# Patient Record
Sex: Female | Born: 1990 | Race: White | Hispanic: No | Marital: Single | Smoking: Current every day smoker
Health system: Southern US, Community
[De-identification: ages and names within clinical notes are randomized; demographics above are authoritative.]

## PROBLEM LIST (undated history)

## (undated) DIAGNOSIS — J40 Bronchitis, not specified as acute or chronic: Secondary | ICD-10-CM

## (undated) DIAGNOSIS — G5600 Carpal tunnel syndrome, unspecified upper limb: Secondary | ICD-10-CM

## (undated) DIAGNOSIS — I10 Essential (primary) hypertension: Secondary | ICD-10-CM

## (undated) DIAGNOSIS — J45909 Unspecified asthma, uncomplicated: Secondary | ICD-10-CM

## (undated) DIAGNOSIS — M7710 Lateral epicondylitis, unspecified elbow: Secondary | ICD-10-CM

## (undated) HISTORY — PX: OTHER SURGICAL HISTORY: SHX169

---

## 2010-11-19 ENCOUNTER — Emergency Department (HOSPITAL_COMMUNITY)
Admission: EM | Admit: 2010-11-19 | Discharge: 2010-11-19 | Payer: Self-pay | Source: Home / Self Care | Admitting: Emergency Medicine

## 2010-11-23 ENCOUNTER — Ambulatory Visit (HOSPITAL_COMMUNITY)
Admission: RE | Admit: 2010-11-23 | Discharge: 2010-11-23 | Payer: Self-pay | Source: Home / Self Care | Attending: Obstetrics and Gynecology | Admitting: Obstetrics and Gynecology

## 2010-11-29 LAB — CBC
HCT: 31.8 % — ABNORMAL LOW (ref 36.0–46.0)
Hemoglobin: 11.4 g/dL — ABNORMAL LOW (ref 12.0–15.0)
MCH: 31.3 pg (ref 26.0–34.0)
MCHC: 35.8 g/dL (ref 30.0–36.0)
MCV: 87.4 fL (ref 78.0–100.0)
Platelets: 108 10*3/uL — ABNORMAL LOW (ref 150–400)
RBC: 3.64 MIL/uL — ABNORMAL LOW (ref 3.87–5.11)
RDW: 12.7 % (ref 11.5–15.5)
WBC: 6.1 10*3/uL (ref 4.0–10.5)

## 2010-11-29 LAB — COMPREHENSIVE METABOLIC PANEL
ALT: 16 U/L (ref 0–35)
AST: 18 U/L (ref 0–37)
Albumin: 3.6 g/dL (ref 3.5–5.2)
Alkaline Phosphatase: 40 U/L (ref 39–117)
BUN: 7 mg/dL (ref 6–23)
CO2: 26 mEq/L (ref 19–32)
Calcium: 8.8 mg/dL (ref 8.4–10.5)
Chloride: 101 mEq/L (ref 96–112)
Creatinine, Ser: 0.48 mg/dL (ref 0.4–1.2)
GFR calc Af Amer: >60 mL/min (ref >60)
GFR calc non Af Amer: >60 mL/min (ref >60)
Glucose, Bld: 82 mg/dL (ref 70–99)
Potassium: 3.8 mEq/L (ref 3.5–5.1)
Sodium: 134 mEq/L — ABNORMAL LOW (ref 135–145)
Total Bilirubin: 0.6 mg/dL (ref 0.3–1.2)
Total Protein: 6.4 g/dL (ref 6.0–8.3)

## 2010-11-29 LAB — DIFFERENTIAL
Basophils Absolute: 0 10*3/uL (ref 0.0–0.1)
Basophils Relative: 1 % (ref 0–1)
Eosinophils Absolute: 0.1 10*3/uL (ref 0.0–0.7)
Eosinophils Relative: 1 % (ref 0–5)
Lymphocytes Relative: 15 % (ref 12–46)
Lymphs Abs: 0.9 10*3/uL (ref 0.7–4.0)
Monocytes Absolute: 0.3 10*3/uL (ref 0.1–1.0)
Monocytes Relative: 6 % (ref 3–12)
Neutro Abs: 4.8 10*3/uL (ref 1.7–7.7)
Neutrophils Relative %: 78 % — ABNORMAL HIGH (ref 43–77)

## 2010-12-04 ENCOUNTER — Other Ambulatory Visit (HOSPITAL_COMMUNITY): Payer: Self-pay | Admitting: Obstetrics and Gynecology

## 2010-12-04 DIAGNOSIS — O2690 Pregnancy related conditions, unspecified, unspecified trimester: Secondary | ICD-10-CM

## 2011-01-04 ENCOUNTER — Ambulatory Visit (HOSPITAL_COMMUNITY): Payer: Medicaid Other

## 2011-01-19 ENCOUNTER — Ambulatory Visit (HOSPITAL_COMMUNITY): Payer: Medicaid Other

## 2011-03-10 ENCOUNTER — Other Ambulatory Visit (HOSPITAL_COMMUNITY): Payer: Self-pay | Admitting: Obstetrics and Gynecology

## 2011-03-10 DIAGNOSIS — O2690 Pregnancy related conditions, unspecified, unspecified trimester: Secondary | ICD-10-CM

## 2011-03-15 ENCOUNTER — Ambulatory Visit (HOSPITAL_COMMUNITY): Payer: Medicaid Other

## 2015-06-04 ENCOUNTER — Encounter (HOSPITAL_COMMUNITY): Payer: Self-pay | Admitting: Emergency Medicine

## 2015-06-04 ENCOUNTER — Emergency Department (HOSPITAL_COMMUNITY)
Admission: EM | Admit: 2015-06-04 | Discharge: 2015-06-04 | Disposition: A | Discharge status: Home / Self Care | Payer: Medicaid Other | Attending: Emergency Medicine | Admitting: Emergency Medicine

## 2015-06-04 ENCOUNTER — Emergency Department (HOSPITAL_COMMUNITY): Payer: Medicaid Other

## 2015-06-04 DIAGNOSIS — Z720 Tobacco use: Secondary | ICD-10-CM | POA: Insufficient documentation

## 2015-06-04 DIAGNOSIS — M25512 Pain in left shoulder: Secondary | ICD-10-CM | POA: Diagnosis present

## 2015-06-04 DIAGNOSIS — M7552 Bursitis of left shoulder: Secondary | ICD-10-CM | POA: Insufficient documentation

## 2015-06-04 MED ORDER — IBUPROFEN 800 MG PO TABS: 800.0000 mg | ORAL_TABLET | Freq: Three times a day (TID) | ORAL | Status: DC

## 2015-06-04 MED ORDER — HYDROCODONE-ACETAMINOPHEN 5-325 MG PO TABS: ORAL_TABLET | ORAL | Status: DC

## 2015-06-04 MED ORDER — HYDROCODONE-ACETAMINOPHEN 5-325 MG PO TABS
1.0000 | ORAL_TABLET | Freq: Once | ORAL | Status: AC
  Administered 2015-06-04: 1 via ORAL
  Filled 2015-06-04: qty 1

## 2015-06-04 MED ORDER — CYCLOBENZAPRINE HCL 10 MG PO TABS: 10.0000 mg | ORAL_TABLET | Freq: Three times a day (TID) | ORAL | Status: DC | PRN

## 2015-06-04 MED ORDER — CYCLOBENZAPRINE HCL 10 MG PO TABS
10.0000 mg | ORAL_TABLET | Freq: Once | ORAL | Status: AC
  Administered 2015-06-04: 10 mg via ORAL
  Filled 2015-06-04: qty 1

## 2015-06-04 NOTE — ED Notes (Signed)
Pt reports left shoulder pain since yesterday. Pt denies any known injury. Limited ROM noted in left shoulder, distal pulses intact.

## 2015-06-04 NOTE — Discharge Instructions (Signed)
Bursitis °Bursitis is when the fluid-filled sac (bursa) that covers and protects a joint gets puffy and irritated. The elbow, shoulder, hip, and knee joints are most often affected. °HOME CARE °· Put ice on the area. °¨ Put ice in a plastic bag. °¨ Place a towel between your skin and the bag. °¨ Leave the ice on for 15-20 minutes, 03-04 times a day. °· Put the joint through a full range of motion 4 times a day. Rest the injured joint at other times. When you have less pain, begin slow movements and usual activities. °· Only take medicine as told by your doctor. °· Follow up with your doctor. Any delay in care could stop the bursitis from healing. This could cause long-term pain. °GET HELP RIGHT AWAY IF:  °· You have more pain with treatment. °· You have a temperature by mouth above 102° F (38.9° C), not controlled by medicine. °· You have heat and irritation over the fluid-filled sac. °MAKE SURE YOU:  °· Understand these instructions. °· Will watch your condition. °· Will get help right away if you are not doing well or get worse. °Document Released: 04/20/2010 Document Revised: 01/23/2012 Document Reviewed: 01/20/2014 °ExitCare® Patient Information ©2015 [employer]. This information is not intended to replace advice given to you by your health care provider. Make sure you discuss any questions you have with your health care provider. ° °

## 2015-06-05 NOTE — ED Provider Notes (Signed)
CSN: [id number]     Arrival date & time 06/04/15  1125 History   First MD Initiated Contact with Patient 06/04/15 1239     Chief Complaint  Patient presents with  . Shoulder Pain     (Consider location/radiation/quality/duration/timing/severity/associated sxs/prior Treatment) HPI   Kaylee Short is a 24 y.o. female who presents to the Emergency Department complaining of left shoulder pain for one day.  She states that she works two jobs that both require lifting and repetitive movements.  She noticed pain with movement of the shoulder and pain improves with the arm held to her chest.  She denies neck pain, headaches, dizziness , numbness or weakness of the extremity.  She has taken OTC analgesics without relief.  History reviewed. No pertinent past medical history. Past Surgical History  Procedure Laterality Date  . Cesarean section     History reviewed. No pertinent family history. History  Substance Use Topics  . Smoking status: Current Every Day Smoker -- 1.00 packs/day  . Smokeless tobacco: Not on file  . Alcohol Use: Not on file   OB History    No data available     Review of Systems  Constitutional: Negative for fever and chills.  Respiratory: Negative for chest tightness and shortness of breath.   Cardiovascular: Negative for chest pain.  Gastrointestinal: Negative for nausea and vomiting.  Genitourinary: Negative for dysuria and difficulty urinating.  Musculoskeletal: Positive for arthralgias (left shoulder pain). Negative for joint swelling and neck pain.  Skin: Negative for color change and wound.  Neurological: Negative for dizziness, syncope, weakness, numbness and headaches.  All other systems reviewed and are negative.     Allergies  Review of patient's allergies indicates not on file.  Home Medications   Prior to Admission medications   Medication Sig Start Date End Date Taking? Authorizing Provider  cyclobenzaprine (FLEXERIL) 10 MG tablet Take 1  tablet (10 mg total) by mouth 3 (three) times daily as needed. 06/04/15   Mayelin Panos, PA-C  HYDROcodone-acetaminophen (NORCO/VICODIN) 5-325 MG per tablet Take one-two tabs po q 4-6 hrs prn pain 06/04/15   Nekoda Chock, PA-C  ibuprofen (ADVIL,MOTRIN) 800 MG tablet Take 1 tablet (800 mg total) by mouth 3 (three) times daily. 06/04/15   Makena Mcgrady, PA-C   BP 152/86 mmHg  Pulse 86  Temp(Src) 98.6 F (37 C) (Oral)  Resp 18  Ht  (1.702 m)  Wt 190 lb (86.183 kg)  BMI 29.75 kg/m2  SpO2 100% Physical Exam  Constitutional: She is oriented to person, place, and time. She appears well-developed and well-nourished. No distress.  HENT:  Head: Normocephalic and atraumatic.  Neck: Normal range of motion. Neck supple. No thyromegaly present.  Cardiovascular: Normal rate, regular rhythm, normal heart sounds and intact distal pulses.   No murmur heard. Pulmonary/Chest: Effort normal and breath sounds normal. No respiratory distress. She exhibits no tenderness.  Musculoskeletal: She exhibits tenderness. She exhibits no edema.  ttp of the anterior left shoulder.  Pain with abduction of the left arm and rotation of the shoulder.  Radial pulse is brisk, distal sensation intact, CR< 2 sec. Grip strength is strong and symmetrical.   No abrasions, edema , erythema or step-off deformity of the joint. Also has muscular tenderness along the left trapezius   Lymphadenopathy:    She has no cervical adenopathy.  Neurological: She is alert and oriented to person, place, and time. She has normal strength. No sensory deficit. She exhibits normal muscle tone. Coordination normal.  Skin: Skin is warm and dry.  Nursing note and vitals reviewed.   ED Course  Procedures (including critical care time) Labs Review Labs Reviewed - No data to display  Imaging Review Dg Shoulder Left  06/04/2015   CLINICAL DATA:  Pain.  No history of trauma.  EXAM: LEFT SHOULDER - 2+ VIEW  COMPARISON:  November 19, 2010  FINDINGS:  Frontal, Y scapular, and axillary images obtained. No fracture or dislocation. Joint spaces appear intact. No erosive change or bony destruction. No intra-articular calcification.  IMPRESSION: No abnormality noted.   Electronically Signed   By: Bretta Bang III M.D.   On: 06/04/2015 11:48     EKG Interpretation None      MDM   Final diagnoses:  Bursitis, shoulder, left    Pt with likely inflammatory bursitis to the left shoulder.  XR neg for fx or dislocation.  Remains NV intact.  No concerning sx's for septic joint.  Pt agrees to symptomatic tx and given referral info for orthopedics.  Appears stable for d/c    Pauline Aus, PA-C 06/05/15 2232  Margarita Grizzle, MD 06/06/15 [phone]

## 2015-07-28 ENCOUNTER — Encounter (HOSPITAL_COMMUNITY): Payer: Self-pay | Admitting: Emergency Medicine

## 2015-07-28 ENCOUNTER — Emergency Department (HOSPITAL_COMMUNITY): Payer: Medicaid Other

## 2015-07-28 ENCOUNTER — Emergency Department (HOSPITAL_COMMUNITY)
Admission: EM | Admit: 2015-07-28 | Discharge: 2015-07-28 | Disposition: A | Discharge status: Home / Self Care | Payer: Medicaid Other | Attending: Emergency Medicine | Admitting: Emergency Medicine

## 2015-07-28 DIAGNOSIS — J029 Acute pharyngitis, unspecified: Secondary | ICD-10-CM | POA: Insufficient documentation

## 2015-07-28 DIAGNOSIS — R0789 Other chest pain: Secondary | ICD-10-CM | POA: Insufficient documentation

## 2015-07-28 DIAGNOSIS — R062 Wheezing: Secondary | ICD-10-CM | POA: Insufficient documentation

## 2015-07-28 DIAGNOSIS — J209 Acute bronchitis, unspecified: Secondary | ICD-10-CM | POA: Insufficient documentation

## 2015-07-28 DIAGNOSIS — R05 Cough: Secondary | ICD-10-CM | POA: Diagnosis present

## 2015-07-28 DIAGNOSIS — J40 Bronchitis, not specified as acute or chronic: Secondary | ICD-10-CM

## 2015-07-28 DIAGNOSIS — Z720 Tobacco use: Secondary | ICD-10-CM | POA: Diagnosis not present

## 2015-07-28 MED ORDER — GUAIFENESIN-CODEINE 100-10 MG/5ML PO SOLN
10.0000 mL | Freq: Once | ORAL | Status: AC
  Administered 2015-07-28: 10 mL via ORAL
  Filled 2015-07-28: qty 10

## 2015-07-28 MED ORDER — ALBUTEROL SULFATE HFA 108 (90 BASE) MCG/ACT IN AERS
2.0000 | INHALATION_SPRAY | Freq: Once | RESPIRATORY_TRACT | Status: AC
  Administered 2015-07-28: 2 via RESPIRATORY_TRACT
  Filled 2015-07-28: qty 6.7

## 2015-07-28 MED ORDER — AZITHROMYCIN 250 MG PO TABS: 250.0000 mg | ORAL_TABLET | Freq: Every day | ORAL | Status: DC

## 2015-07-28 MED ORDER — GUAIFENESIN-CODEINE 100-10 MG/5ML PO SYRP: 10.0000 mL | ORAL_SOLUTION | Freq: Three times a day (TID) | ORAL | Status: DC | PRN

## 2015-07-28 NOTE — ED Notes (Signed)
Cough x 5days, small amount of yellow sputum, started with sore throat which is now better

## 2015-07-28 NOTE — Discharge Instructions (Signed)
How to Use an Inhaler °Using your inhaler correctly is very important. Good technique will make sure that the medicine reaches your lungs.  °HOW TO USE AN INHALER: °1. Take the cap off the inhaler. °2. If this is the first time using your inhaler, you need to prime it. Shake the inhaler for 5 seconds. Release four puffs into the air, away from your face. Ask your doctor for help if you have questions. °3. Shake the inhaler for 5 seconds. °4. Turn the inhaler so the bottle is above the mouthpiece. °5. Put your pointer finger on top of the bottle. Your thumb holds the bottom of the inhaler. °6. Open your mouth. °7. Either hold the inhaler away from your mouth (the width of 2 fingers) or place your lips tightly around the mouthpiece. Ask your doctor which way to use your inhaler. °8. Breathe out as much air as possible. °9. Breathe in and push down on the bottle 1 time to release the medicine. You will feel the medicine go in your mouth and throat. °10. Continue to take a deep breath in very slowly. Try to fill your lungs. °11. After you have breathed in completely, hold your breath for 10 seconds. This will help the medicine to settle in your lungs. If you cannot hold your breath for 10 seconds, hold it for as long as you can before you breathe out. °12. Breathe out slowly, through pursed lips. Whistling is an example of pursed lips. °13. If your doctor has told you to take more than 1 puff, wait at least 15-30 seconds between puffs. This will help you get the best results from your medicine. Do not use the inhaler more than your doctor tells you to. °14. Put the cap back on the inhaler. °15. Follow the directions from your doctor or from the inhaler package about cleaning the inhaler. °If you use more than one inhaler, ask your doctor which inhalers to use and what order to use them in. Ask your doctor to help you figure out when you will need to refill your inhaler.  °If you use a steroid inhaler, always rinse your  mouth with water after your last puff, gargle and spit out the water. Do not swallow the water. °GET HELP IF: °· The inhaler medicine only partially helps to stop wheezing or shortness of breath. °· You are having trouble using your inhaler. °· You have some increase in thick spit (phlegm). °GET HELP RIGHT AWAY IF: °· The inhaler medicine does not help your wheezing or shortness of breath or you have tightness in your chest. °· You have dizziness, headaches, or fast heart rate. °· You have chills, fever, or night sweats. °· You have a large increase of thick spit, or your thick spit is bloody. °MAKE SURE YOU:  °· Understand these instructions. °· Will watch your condition. °· Will get help right away if you are not doing well or get worse. °Document Released: 08/09/2008 Document Revised: 08/21/2013 Document Reviewed: 05/30/2013 °ExitCare® Patient Information ©2015 [employer]. This information is not intended to replace advice given to you by your health care provider. Make sure you discuss any questions you have with your health care provider. ° °

## 2015-07-28 NOTE — ED Provider Notes (Signed)
CSN: [id number]     Arrival date & time 07/28/15  1957 History   First MD Initiated Contact with Patient 07/28/15 1959     Chief Complaint  Patient presents with  . Cough     (Consider location/radiation/quality/duration/timing/severity/associated sxs/prior Treatment) HPI   Kaylee Short is a 24 y.o. female who presents to the Emergency Department complaining of persistent cough for 5 days.  She reports intermittent green to yellow sputum production.  She also notes a mild sore throat which has been improving and right lateral rib pain associated with the cough.  She states the cough is worse with speaking and lying down, improves when sitting upright.  She has tried OTC cough medications without relief.  She denies shortness of breath, fever, bloody sputum and vomiting.     History reviewed. No pertinent past medical history. Past Surgical History  Procedure Laterality Date  . Cesarean section     No family history on file. Social History  Substance Use Topics  . Smoking status: Current Every Day Smoker -- 1.00 packs/day  . Smokeless tobacco: None  . Alcohol Use: No   OB History    No data available     Review of Systems  Constitutional: Negative for fever, chills, activity change and appetite change.  HENT: Positive for congestion and sore throat. Negative for facial swelling, rhinorrhea and trouble swallowing.   Eyes: Negative for visual disturbance.  Respiratory: Positive for cough. Negative for chest tightness, shortness of breath, wheezing and stridor.   Cardiovascular:       Right rib pain  Gastrointestinal: Negative for nausea, vomiting and abdominal pain.  Musculoskeletal: Negative for neck pain and neck stiffness.  Skin: Negative.   Neurological: Negative for dizziness, weakness, numbness and headaches.  Hematological: Negative for adenopathy.  Psychiatric/Behavioral: Negative for confusion.  All other systems reviewed and are negative.     Allergies   Review of patient's allergies indicates no known allergies.  Home Medications   Prior to Admission medications   Medication Sig Start Date End Date Taking? Authorizing Provider  guaiFENesin (SM TUSSIN MUCUS+CHEST CONGEST) 100 MG/5ML liquid Take 200 mg by mouth 3 (three) times daily as needed for cough or congestion.   Yes Historical Provider, MD  levonorgestrel (MIRENA) 20 MCG/24HR IUD 1 each by Intrauterine route once.   Yes Historical Provider, MD  azithromycin (ZITHROMAX) 250 MG tablet Take 1 tablet (250 mg total) by mouth daily. Take first 2 tablets together, then 1 every day until finished. 07/28/15   Arie Powell, PA-C  guaiFENesin-codeine (ROBITUSSIN AC) 100-10 MG/5ML syrup Take 10 mLs by mouth 3 (three) times daily as needed for cough. 07/28/15   Jadd Gasior, PA-C   BP 134/94 mmHg  Pulse 93  Temp(Src) 97.6 F (36.4 C) (Oral)  Resp 18  Ht 5' 7.5" (1.715 m)  Wt 185 lb (83.915 kg)  BMI 28.53 kg/m2  SpO2 100%  LMP 07/28/2015 Physical Exam  Constitutional: She is oriented to person, place, and time. She appears well-developed and well-nourished. No distress.  HENT:  Head: Normocephalic and atraumatic.  Right Ear: Tympanic membrane and ear canal normal.  Left Ear: Tympanic membrane and ear canal normal.  Mouth/Throat: Uvula is midline, oropharynx is clear and moist and mucous membranes are normal. No oropharyngeal exudate.  Eyes: EOM are normal. Pupils are equal, round, and reactive to light.  Neck: Normal range of motion, full passive range of motion without pain and phonation normal. Neck supple.  Cardiovascular: Normal rate, regular rhythm, normal  heart sounds and intact distal pulses.   No murmur heard. Pulmonary/Chest: Effort normal. No stridor. No respiratory distress. She has wheezes. She has no rales. She exhibits no tenderness.  Coarse lungs sounds bilaterally with mild inspiratory wheezes.  No rales.  Mild ttp of the lateral right ribs.  No crepitus or edema   Abdominal: Soft. She exhibits no distension. There is no tenderness.  Musculoskeletal: Normal range of motion. She exhibits no edema.  Lymphadenopathy:    She has no cervical adenopathy.  Neurological: She is alert and oriented to person, place, and time. She exhibits normal muscle tone. Coordination normal.  Skin: Skin is warm and dry.  Nursing note and vitals reviewed.   ED Course  Procedures (including critical care time) Labs Review Labs Reviewed - No data to display  Imaging Review Dg Chest 2 View  07/28/2015   CLINICAL DATA:  Cough and wheezing for 5 days.  Shortness of breath.  EXAM: CHEST  2 VIEW  COMPARISON:  Chest radiograph 02/11/2015  FINDINGS: The heart size and mediastinal contours are within normal limits. Both lungs are clear. The visualized skeletal structures are unremarkable.  IMPRESSION: No active cardiopulmonary disease.   Electronically Signed   By: Annia Belt M.D.   On: 07/28/2015 20:33   I have personally reviewed and evaluated these images and lab results as part of my medical decision-making.   EKG Interpretation None      MDM   Final diagnoses:  Bronchitis    Pt is well appearing, non-toxic.  Vitals stable.  Lung sounds improved after albuterol.  She agrees to f/u with PMD or to return here for any worsening symptoms.  Appears stable for d/c    Pauline Aus, PA-C 07/28/15 2127  Rolland Porter, MD 08/01/15 [phone]

## 2015-08-03 ENCOUNTER — Encounter (HOSPITAL_COMMUNITY): Payer: Self-pay

## 2015-08-03 ENCOUNTER — Emergency Department (HOSPITAL_COMMUNITY)
Admission: EM | Admit: 2015-08-03 | Discharge: 2015-08-03 | Disposition: A | Discharge status: Home / Self Care | Payer: Medicaid Other | Attending: Emergency Medicine | Admitting: Emergency Medicine

## 2015-08-03 DIAGNOSIS — J40 Bronchitis, not specified as acute or chronic: Secondary | ICD-10-CM | POA: Diagnosis not present

## 2015-08-03 DIAGNOSIS — Z720 Tobacco use: Secondary | ICD-10-CM | POA: Insufficient documentation

## 2015-08-03 DIAGNOSIS — R05 Cough: Secondary | ICD-10-CM | POA: Diagnosis present

## 2015-08-03 HISTORY — DX: Bronchitis, not specified as acute or chronic: J40

## 2015-08-03 MED ORDER — BENZONATATE 200 MG PO CAPS: 200.0000 mg | ORAL_CAPSULE | Freq: Three times a day (TID) | ORAL | Status: DC | PRN

## 2015-08-03 MED ORDER — PREDNISONE 10 MG PO TABS: 20.0000 mg | ORAL_TABLET | Freq: Two times a day (BID) | ORAL | Status: DC

## 2015-08-03 NOTE — ED Notes (Signed)
Pt reports was diagnosed with bronchitis and was told to return if no better after antibiotics.  Pt says is out of antibiotics and cough syrup and still has productive cough.  Unknown if has had fever.

## 2015-08-03 NOTE — ED Provider Notes (Signed)
CSN: [id number]     Arrival date & time 08/03/15  1809 History  This chart was scribed for non-physician practitioner, Kerrie Buffalo, NP working with Rolland Porter, MD by Gwenyth Ober, ED scribe. This patient was seen in room APFT21/[facility] and the patient's care was started at 6:43 PM   Chief Complaint  Patient presents with  . Bronchitis   Patient is a 24 y.o. female presenting with cough. The history is provided by the patient. No language interpreter was used.  Cough Cough characteristics:  Productive Severity:  Moderate Onset quality:  Gradual Duration:  11 days Timing:  Intermittent Progression:  Improving Chronicity:  New Relieved by:  Cough suppressants Worsened by:  Lying down Associated symptoms: no fever     HPI Comments: Kaylee Short is a 24 y.o. female who presents to the Emergency Department complaining of intermittent, moderate, hacking cough with yellow sputum that started 11 days ago. Her symptoms become worse with lying down. Pt was seen in the ED on 9/13 for the same and was prescribed a Z-pack and Robutussin-AC. She has finished the medications as prescribed with mild improvement in her symptoms. Pt denies fever.   Past Medical History  Diagnosis Date  . Bronchitis    Past Surgical History  Procedure Laterality Date  . Cesarean section     No family history on file. Social History  Substance Use Topics  . Smoking status: Current Every Day Smoker -- 1.00 packs/day  . Smokeless tobacco: None  . Alcohol Use: No   OB History    No data available     Review of Systems  Constitutional: Negative for fever.  Respiratory: Positive for cough.   All other systems reviewed and are negative.  Allergies  Review of patient's allergies indicates no known allergies.  Home Medications   Prior to Admission medications   Medication Sig Start Date End Date Taking? Authorizing Cydnee Fuquay  azithromycin (ZITHROMAX) 250 MG tablet Take 1 tablet (250 mg total) by mouth  daily. Take first 2 tablets together, then 1 every day until finished. 07/28/15   Tammy Triplett, PA-C  benzonatate (TESSALON) 200 MG capsule Take 1 capsule (200 mg total) by mouth 3 (three) times daily as needed for cough. 08/03/15   Hope Orlene Och, NP  guaiFENesin (SM TUSSIN MUCUS+CHEST CONGEST) 100 MG/5ML liquid Take 200 mg by mouth 3 (three) times daily as needed for cough or congestion.    Historical Jannice Beitzel, MD  guaiFENesin-codeine (ROBITUSSIN AC) 100-10 MG/5ML syrup Take 10 mLs by mouth 3 (three) times daily as needed for cough. 07/28/15   Tammy Triplett, PA-C  levonorgestrel (MIRENA) 20 MCG/24HR IUD 1 each by Intrauterine route once.    Historical Gibbs Naugle, MD  predniSONE (DELTASONE) 10 MG tablet Take 2 tablets (20 mg total) by mouth 2 (two) times daily with a meal. 08/03/15   Hope Orlene Och, NP   BP 138/91 mmHg  Pulse 94  Temp(Src) 98.1 F (36.7 C) (Oral)  Resp 20  Ht 5' 7.5" (1.715 m)  Wt 185 lb (83.915 kg)  BMI 28.53 kg/m2  SpO2 100%  LMP 07/28/2015 Physical Exam  Constitutional: She appears well-developed and well-nourished. No distress.  HENT:  Head: Normocephalic and atraumatic.  Mouth/Throat: No oropharyngeal exudate.  Erythema, but no exudate or swelling Uvula midline  Eyes: Conjunctivae and EOM are normal.  Neck: Neck supple. No tracheal deviation present.  Cardiovascular: Normal rate, regular rhythm and normal heart sounds.   Pulmonary/Chest: Effort normal and breath sounds normal. No respiratory distress.  Wheezes: occasional. She has no rales.  Skin: Skin is warm and dry.  Psychiatric: She has a normal mood and affect. Her behavior is normal.  Nursing note and vitals reviewed.   ED Course  Procedures   DIAGNOSTIC STUDIES: Oxygen Saturation is 100% on RA, normal by my interpretation.    COORDINATION OF CARE: 6:48 PM Discussed treatment plan with pt which includes Tessalon Pearls. Pt agreed to plan.  MDM  24 y.o. female with cough and congestion despite starting  Z-pac.  Will start Tessalon Pearls and prednisone and she will use Albuterol inhaler as needed. She will follow up with her PCP or return here as needed.   Final diagnoses:  Bronchitis   I personally performed the services described in this documentation, which was scribed in my presence. The recorded information has been reviewed and is accurate.   [address] [place], [place] 08/06/15 2326  Rolland Porter, MD 08/07/15 1000

## 2015-10-11 ENCOUNTER — Emergency Department (HOSPITAL_COMMUNITY)
Admission: EM | Admit: 2015-10-11 | Discharge: 2015-10-11 | Disposition: A | Discharge status: Home / Self Care | Payer: Medicaid Other | Attending: Emergency Medicine | Admitting: Emergency Medicine

## 2015-10-11 ENCOUNTER — Encounter (HOSPITAL_COMMUNITY): Payer: Self-pay | Admitting: Emergency Medicine

## 2015-10-11 DIAGNOSIS — F17200 Nicotine dependence, unspecified, uncomplicated: Secondary | ICD-10-CM | POA: Diagnosis not present

## 2015-10-11 DIAGNOSIS — M542 Cervicalgia: Secondary | ICD-10-CM | POA: Diagnosis not present

## 2015-10-11 DIAGNOSIS — J069 Acute upper respiratory infection, unspecified: Secondary | ICD-10-CM | POA: Insufficient documentation

## 2015-10-11 DIAGNOSIS — Z7952 Long term (current) use of systemic steroids: Secondary | ICD-10-CM | POA: Insufficient documentation

## 2015-10-11 DIAGNOSIS — J029 Acute pharyngitis, unspecified: Secondary | ICD-10-CM | POA: Diagnosis present

## 2015-10-11 MED ORDER — ONDANSETRON HCL 4 MG PO TABS
4.0000 mg | ORAL_TABLET | Freq: Once | ORAL | Status: AC
  Administered 2015-10-11: 4 mg via ORAL
  Filled 2015-10-11: qty 1

## 2015-10-11 MED ORDER — PREDNISONE 20 MG PO TABS
40.0000 mg | ORAL_TABLET | Freq: Once | ORAL | Status: AC
  Administered 2015-10-11: 40 mg via ORAL
  Filled 2015-10-11: qty 2

## 2015-10-11 MED ORDER — LORATADINE-PSEUDOEPHEDRINE ER 5-120 MG PO TB12: 1.0000 | ORAL_TABLET | Freq: Two times a day (BID) | ORAL | Status: DC

## 2015-10-11 MED ORDER — IBUPROFEN 800 MG PO TABS
800.0000 mg | ORAL_TABLET | Freq: Once | ORAL | Status: AC
  Administered 2015-10-11: 800 mg via ORAL
  Filled 2015-10-11: qty 1

## 2015-10-11 MED ORDER — PREDNISONE 10 MG PO TABS: ORAL_TABLET | ORAL | Status: DC

## 2015-10-11 MED ORDER — ACETAMINOPHEN 500 MG PO TABS
500.0000 mg | ORAL_TABLET | Freq: Once | ORAL | Status: AC
  Administered 2015-10-11: 500 mg via ORAL
  Filled 2015-10-11: qty 1

## 2015-10-11 NOTE — ED Notes (Signed)
Pt c/o sore throat x2 days and denies any nasal congestion.

## 2015-10-11 NOTE — ED Provider Notes (Signed)
History  By signing my name below, I, Karle PlumberJennifer Tensley, attest that this documentation has been prepared under the direction and in the presence of Ivery QualeHobson Kerstin Crusoe, PA-C. Electronically Signed: Karle PlumberJennifer Tensley, ED Scribe. 10/11/2015. 7:56 PM.  Chief Complaint  Patient presents with  . Sore Throat   The history is provided by the patient and medical records. No language interpreter was used.    HPI Comments:  Kaylee Short is a 24 y.o. female who presents to the Emergency Department complaining of severe sore throat that began two days ago. She reports associated generalized body aches, neck pain, chills and diaphoresis. She has taken Aleve to with little relief of her symptoms. She denies modifying factors. She denies rash, diarrhea, cough, nausea, vomiting, or fever.  Past Medical History  Diagnosis Date  . Bronchitis    Past Surgical History  Procedure Laterality Date  . Cesarean section     History reviewed. No pertinent family history. Social History  Substance Use Topics  . Smoking status: Current Every Day Smoker -- 1.00 packs/day  . Smokeless tobacco: None  . Alcohol Use: No   OB History    No data available     Review of Systems  Constitutional: Positive for chills and diaphoresis. Negative for fever.  HENT: Positive for sore throat.   Respiratory: Negative for cough.   Gastrointestinal: Negative for nausea and vomiting.  All other systems reviewed and are negative.   Allergies  Review of patient's allergies indicates no known allergies.  Home Medications   Prior to Admission medications   Medication Sig Start Date End Date Taking? Authorizing Provider  azithromycin (ZITHROMAX) 250 MG tablet Take 1 tablet (250 mg total) by mouth daily. Take first 2 tablets together, then 1 every day until finished. 07/28/15   Tammy Triplett, PA-C  benzonatate (TESSALON) 200 MG capsule Take 1 capsule (200 mg total) by mouth 3 (three) times daily as needed for cough. 08/03/15    Hope Orlene OchM Neese, NP  guaiFENesin (SM TUSSIN MUCUS+CHEST CONGEST) 100 MG/5ML liquid Take 200 mg by mouth 3 (three) times daily as needed for cough or congestion.    Historical Provider, MD  guaiFENesin-codeine (ROBITUSSIN AC) 100-10 MG/5ML syrup Take 10 mLs by mouth 3 (three) times daily as needed for cough. 07/28/15   Tammy Triplett, PA-C  levonorgestrel (MIRENA) 20 MCG/24HR IUD 1 each by Intrauterine route once.    Historical Provider, MD  predniSONE (DELTASONE) 10 MG tablet Take 2 tablets (20 mg total) by mouth 2 (two) times daily with a meal. 08/03/15   Hope Orlene OchM Neese, NP   Triage Vitals: BP 155/91 mmHg  Pulse 92  Temp(Src) 98.1 F (36.7 C) (Oral)  Resp 16  Ht 5\' 7"  (1.702 m)  Wt 190 lb (86.183 kg)  BMI 29.75 kg/m2  SpO2 100%  LMP 10/11/2015 Physical Exam  Constitutional: She is oriented to person, place, and time. She appears well-developed and well-nourished.  HENT:  Head: Normocephalic and atraumatic.  Mouth/Throat: Uvula is midline and mucous membranes are normal. Posterior oropharyngeal erythema present. No tonsillar abscesses.  Nasal congestion present. Mild to moderate increased redness to the posterior oropharynx. No sign of peritonsillar abscess.  Eyes: EOM are normal.  Neck: Normal range of motion.  Cardiovascular: Normal rate, regular rhythm and normal heart sounds.  Exam reveals no gallop and no friction rub.   No murmur heard. Cap refill less than 2 seconds.  Pulmonary/Chest: Effort normal and breath sounds normal. No respiratory distress. She has no wheezes. She has  no rales.  Symmetrical rise and fall of the chest.  Abdominal: Soft. Bowel sounds are normal. There is no tenderness.  Musculoskeletal: Normal range of motion.  Lymphadenopathy:    She has no cervical adenopathy.  Neurological: She is alert and oriented to person, place, and time.  Skin: Skin is warm and dry.  Psychiatric: She has a normal mood and affect. Her behavior is normal.  Nursing note and vitals  reviewed.   ED Course  Procedures (including critical care time) DIAGNOSTIC STUDIES: Oxygen Saturation is 100% on RA, normal by my interpretation.   COORDINATION OF CARE: 7:51 PM- Advised pt that her symptoms area likely viral. Advised pt to take Aleve every 12 hours and take OTC Tylenol in between doses of Aleve for relief of pain. Will prescribe Prednisone dose pack and Claritin D. Pt verbalizes understanding and agrees to plan.  Medications - No data to display   MDM  Vital signs are well within normal limits. The examination favors an upper respiratory illness. The patient will use salt water gargles and Chloraseptic Spray for sore throat. Patient will use a prednisone taper, and Claritin-D for congestion. Patient is currently using naproxen. I have encouraged patient use Tylenol in between the naproxen doses to help with aching and any fevers. The patient is to see the primary physician, or return to the emergency department immediately if any changes, problems, or concerns. `   Final diagnoses:  None    **I have reviewed nursing notes, vital signs, and all appropriate lab and imaging results for this patient.*  I personally performed the services described in this documentation, which was scribed in my presence. The recorded information has been reviewed and is accurate.    Ivery Quale, PA-C 10/11/15 2011  Samuel Jester, DO 10/13/15 2005

## 2015-10-11 NOTE — Discharge Instructions (Signed)
Please wash hands frequently. Use prednisone taper as prescribed, use Claritin-D 2 times daily for nasal congestion. Please increase fluids. Saltwater gargles 2 or 3 times daily. Continue your naproxen, may use Tylenol in between the naproxen doses if needed for aching. Upper Respiratory Infection, Adult Most upper respiratory infections (URIs) are caused by a virus. A URI affects the nose, throat, and upper air passages. The most common type of URI is often called "the common cold." HOME CARE   Take medicines only as told by your doctor.  Gargle warm saltwater or take cough drops to comfort your throat as told by your doctor.  Use a warm mist humidifier or inhale steam from a shower to increase air moisture. This may make it easier to breathe.  Drink enough fluid to keep your pee (urine) clear or pale yellow.  Eat soups and other clear broths.  Have a healthy diet.  Rest as needed.  Go back to work when your fever is gone or your doctor says it is okay.  You may need to stay home longer to avoid giving your URI to others.  You can also wear a face mask and wash your hands often to prevent spread of the virus.  Use your inhaler more if you have asthma.  Do not use any tobacco products, including cigarettes, chewing tobacco, or electronic cigarettes. If you need help quitting, ask your doctor. GET HELP IF:  You are getting worse, not better.  Your symptoms are not helped by medicine.  You have chills.  You are getting more short of breath.  You have brown or red mucus.  You have yellow or brown discharge from your nose.  You have pain in your face, especially when you bend forward.  You have a fever.  You have puffy (swollen) neck glands.  You have pain while swallowing.  You have white areas in the back of your throat. GET HELP RIGHT AWAY IF:   You have very bad or constant:  Headache.  Ear pain.  Pain in your forehead, behind your eyes, and over your  cheekbones (sinus pain).  Chest pain.  You have long-lasting (chronic) lung disease and any of the following:  Wheezing.  Long-lasting cough.  Coughing up blood.  A change in your usual mucus.  You have a stiff neck.  You have changes in your:  Vision.  Hearing.  Thinking.  Mood. MAKE SURE YOU:   Understand these instructions.  Will watch your condition.  Will get help right away if you are not doing well or get worse.   This information is not intended to replace advice given to you by your health care provider. Make sure you discuss any questions you have with your health care provider.   Document Released: 04/18/2008 Document Revised: 03/17/2015 Document Reviewed: 02/05/2014 Elsevier Interactive Patient Education [employer]2016 [employer] Inc[employer]

## 2015-11-08 ENCOUNTER — Encounter (HOSPITAL_COMMUNITY): Payer: Self-pay | Admitting: *Deleted

## 2015-11-08 DIAGNOSIS — J209 Acute bronchitis, unspecified: Secondary | ICD-10-CM | POA: Diagnosis not present

## 2015-11-08 DIAGNOSIS — F17200 Nicotine dependence, unspecified, uncomplicated: Secondary | ICD-10-CM | POA: Insufficient documentation

## 2015-11-08 DIAGNOSIS — G43009 Migraine without aura, not intractable, without status migrainosus: Secondary | ICD-10-CM | POA: Diagnosis not present

## 2015-11-08 DIAGNOSIS — R51 Headache: Secondary | ICD-10-CM | POA: Diagnosis present

## 2015-11-08 NOTE — ED Notes (Signed)
Pt c/o fever and aches and pains all over body x 2 days

## 2015-11-09 ENCOUNTER — Emergency Department (HOSPITAL_COMMUNITY): Payer: Medicaid Other

## 2015-11-09 ENCOUNTER — Emergency Department (HOSPITAL_COMMUNITY)
Admission: EM | Admit: 2015-11-09 | Discharge: 2015-11-09 | Disposition: A | Discharge status: Home / Self Care | Payer: Medicaid Other | Attending: Emergency Medicine | Admitting: Emergency Medicine

## 2015-11-09 DIAGNOSIS — G43009 Migraine without aura, not intractable, without status migrainosus: Secondary | ICD-10-CM

## 2015-11-09 DIAGNOSIS — J40 Bronchitis, not specified as acute or chronic: Secondary | ICD-10-CM

## 2015-11-09 MED ORDER — HYDROMORPHONE HCL 1 MG/ML IJ SOLN
1.0000 mg | Freq: Once | INTRAMUSCULAR | Status: AC
  Administered 2015-11-09: 1 mg via INTRAVENOUS
  Filled 2015-11-09: qty 1

## 2015-11-09 MED ORDER — PROCHLORPERAZINE EDISYLATE 5 MG/ML IJ SOLN
10.0000 mg | Freq: Once | INTRAMUSCULAR | Status: AC
  Administered 2015-11-09: 10 mg via INTRAVENOUS

## 2015-11-09 MED ORDER — DIPHENHYDRAMINE HCL 50 MG/ML IJ SOLN
25.0000 mg | Freq: Once | INTRAMUSCULAR | Status: AC
  Administered 2015-11-09: 25 mg via INTRAVENOUS
  Filled 2015-11-09: qty 1

## 2015-11-09 MED ORDER — AZITHROMYCIN 250 MG PO TABS: ORAL_TABLET | ORAL | Status: DC

## 2015-11-09 MED ORDER — ONDANSETRON HCL 4 MG PO TABS: 4.0000 mg | ORAL_TABLET | Freq: Three times a day (TID) | ORAL | Status: DC | PRN

## 2015-11-09 MED ORDER — SODIUM CHLORIDE 0.9 % IV SOLN
1000.0000 mL | INTRAVENOUS | Status: DC
  Administered 2015-11-09 (×2): 1000 mL via INTRAVENOUS

## 2015-11-09 MED ORDER — SODIUM CHLORIDE 0.9 % IV BOLUS (SEPSIS)
1000.0000 mL | Freq: Once | INTRAVENOUS | Status: AC
  Administered 2015-11-09: 1000 mL via INTRAVENOUS

## 2015-11-09 MED ORDER — PROCHLORPERAZINE EDISYLATE 5 MG/ML IJ SOLN
INTRAMUSCULAR | Status: AC
  Filled 2015-11-09: qty 2

## 2015-11-09 MED ORDER — DEXAMETHASONE SODIUM PHOSPHATE 10 MG/ML IJ SOLN
10.0000 mg | Freq: Once | INTRAMUSCULAR | Status: AC
  Administered 2015-11-09: 10 mg via INTRAVENOUS
  Filled 2015-11-09: qty 1

## 2015-11-09 MED ORDER — METOCLOPRAMIDE HCL 5 MG/ML IJ SOLN
10.0000 mg | Freq: Once | INTRAMUSCULAR | Status: AC
  Administered 2015-11-09: 10 mg via INTRAVENOUS
  Filled 2015-11-09: qty 2

## 2015-11-09 MED ORDER — VALPROATE SODIUM 500 MG/5ML IV SOLN
INTRAVENOUS | Status: AC
  Filled 2015-11-09: qty 10

## 2015-11-09 MED ORDER — SODIUM CHLORIDE 0.9 % IV SOLN
1000.0000 mL | Freq: Once | INTRAVENOUS | Status: AC
  Administered 2015-11-09: 1000 mL via INTRAVENOUS

## 2015-11-09 MED ORDER — VALPROATE SODIUM 500 MG/5ML IV SOLN
1.0000 g | Freq: Once | INTRAVENOUS | Status: AC
  Administered 2015-11-09: 1000 mg via INTRAVENOUS
  Filled 2015-11-09: qty 10

## 2015-11-09 MED ORDER — DM-GUAIFENESIN ER 30-600 MG PO TB12
1.0000 | ORAL_TABLET | Freq: Two times a day (BID) | ORAL | Status: DC
  Administered 2015-11-09: 1 via ORAL
  Filled 2015-11-09: qty 1

## 2015-11-09 MED ORDER — ISOMETHEPTENE-DICHLORAL-APAP 65-100-325 MG PO CAPS: 1.0000 | ORAL_CAPSULE | Freq: Four times a day (QID) | ORAL | Status: DC | PRN

## 2015-11-09 MED ORDER — MAGNESIUM SULFATE 2 GM/50ML IV SOLN
2.0000 g | Freq: Once | INTRAVENOUS | Status: AC
  Administered 2015-11-09: 2 g via INTRAVENOUS
  Filled 2015-11-09: qty 50

## 2015-11-09 MED ORDER — DIHYDROERGOTAMINE MESYLATE 1 MG/ML IJ SOLN
0.5000 mg | Freq: Once | INTRAMUSCULAR | Status: DC
  Filled 2015-11-09: qty 0.5

## 2015-11-09 NOTE — Discharge Instructions (Signed)
Drink plenty of fluids. Use the midrin if your headache returns, you can take it with ibuprofen 600 mg 4 times a day OR Aleve 2 tabs twice a day. Use the zofran for nausea or vomiting. Take the zpak for your bronchitis. You can take mucinex DM OTC for your cough.  Recheck if you get a fever or seem worse.    Migraine Headache A migraine headache is an intense, throbbing pain on one or both sides of your head. A migraine can last for 30 minutes to several hours. CAUSES  The exact cause of a migraine headache is not always known. However, a migraine may be caused when nerves in the brain become irritated and release chemicals that cause inflammation. This causes pain. Certain things may also trigger migraines, such as:  Alcohol.  Smoking.  Stress.  Menstruation.  Aged cheeses.  Foods or drinks that contain nitrates, glutamate, aspartame, or tyramine.  Lack of sleep.  Chocolate.  Caffeine.  Hunger.  Physical exertion.  Fatigue.  Medicines used to treat chest pain (nitroglycerine), birth control pills, estrogen, and some blood pressure medicines. SIGNS AND SYMPTOMS  Pain on one or both sides of your head.  Pulsating or throbbing pain.  Severe pain that prevents daily activities.  Pain that is aggravated by any physical activity.  Nausea, vomiting, or both.  Dizziness.  Pain with exposure to bright lights, loud noises, or activity.  General sensitivity to bright lights, loud noises, or smells. Before you get a migraine, you may get warning signs that a migraine is coming (aura). An aura may include:  Seeing flashing lights.  Seeing bright spots, halos, or zigzag lines.  Having tunnel vision or blurred vision.  Having feelings of numbness or tingling.  Having trouble talking.  Having muscle weakness. DIAGNOSIS  A migraine headache is often diagnosed based on:  Symptoms.  Physical exam.  A CT scan or MRI of your head. These imaging tests cannot diagnose  migraines, but they can help rule out other causes of headaches. TREATMENT Medicines may be given for pain and nausea. Medicines can also be given to help prevent recurrent migraines.  HOME CARE INSTRUCTIONS  Only take over-the-counter or prescription medicines for pain or discomfort as directed by your health care provider. The use of long-term narcotics is not recommended.  Lie down in a dark, quiet room when you have a migraine.  Keep a journal to find out what may trigger your migraine headaches. For example, write down:  What you eat and drink.  How much sleep you get.  Any change to your diet or medicines.  Limit alcohol consumption.  Quit smoking if you smoke.  Get 7-9 hours of sleep, or as recommended by your health care provider.  Limit stress.  Keep lights dim if bright lights bother you and make your migraines worse. SEEK IMMEDIATE MEDICAL CARE IF:   Your migraine becomes severe.  You have a fever.  You have a stiff neck.  You have vision loss.  You have muscular weakness or loss of muscle control.  You start losing your balance or have trouble walking.  You feel faint or pass out.  You have severe symptoms that are different from your first symptoms. MAKE SURE YOU:   Understand these instructions.  Will watch your condition.  Will get help right away if you are not doing well or get worse.   This information is not intended to replace advice given to you by your health care provider. Make  sure you discuss any questions you have with your health care provider.   Document Released: 10/31/2005 Document Revised: 11/21/2014 Document Reviewed: 07/08/2013 Elsevier Interactive Patient Education [employer]

## 2015-11-09 NOTE — ED Notes (Signed)
Pt ambulated to bathroom 

## 2015-11-09 NOTE — ED Provider Notes (Signed)
CSN: [id number]     Arrival date & time 11/08/15  2301 History   First MD Initiated Contact with Patient 11/09/15 0205     Chief Complaint  Patient presents with  . URI     (Consider location/radiation/quality/duration/timing/severity/associated sxs/prior Treatment) HPI patient reports that Friday, December 23 she started getting nausea without vomiting or diarrhea. She started getting a headache the evening of December 24. She describes the headache as frontal and throbbing. She denies any blurred or double vision. She states she does have photophobia and noise sensitivity. She has never had a headache like this before. She also has had a mild cough which makes her headache hurt more and has rhinorrhea from crying. She denies any fever although she does have diffuse body aches. She states she did not have the flu shot. She states she was around a nephew who was sick and possibly had the flu. She denies any family history of migraine headaches.  PCP none  Past Medical History  Diagnosis Date  . Bronchitis    Past Surgical History  Procedure Laterality Date  . Cesarean section     History reviewed. No pertinent family history. Social History  Substance Use Topics  . Smoking status: Current Every Day Smoker -- 1.00 packs/day  . Smokeless tobacco: None  . 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     Allergies  Review of patient's allergies indicates no known allergies.  Home Medications   Prior to Admission medications   Medication Sig Start Date End Date Taking? Authorizing Provider  azithromycin (ZITHROMAX) 250 MG tablet Take 2 po the first day then once a day for the next 4 days. 11/09/15   Devoria AlbeIva Solomon Skowronek, MD  guaiFENesin (SM TUSSIN MUCUS+CHEST CONGEST) 100 MG/5ML liquid Take 200 mg by mouth 3 (three) times daily as needed for cough or congestion.     Historical Provider, MD  guaiFENesin-codeine (ROBITUSSIN AC) 100-10 MG/5ML syrup Take 10 mLs by mouth 3 (three) times daily as needed for cough. 07/28/15   Tammy Triplett, PA-C  isometheptene-acetaminophen-dichloralphenazone (MIDRIN) 65-100-325 MG capsule Take 1 capsule by mouth 4 (four) times daily as needed for migraine. Maximum 5 capsules in 12 hours for migraine headaches, 8 capsules in 24 hours for tension headaches. 11/09/15   Devoria AlbeIva Sheniah Supak, MD  levonorgestrel (MIRENA) 20 MCG/24HR IUD 1 each by Intrauterine route once.    Historical Provider, MD  ondansetron (ZOFRAN) 4 MG tablet Take 1 tablet (4 mg total) by mouth every 8 (eight) hours as needed for nausea or vomiting. 11/09/15   Devoria AlbeIva Trinka Keshishyan, MD   BP 143/92 mmHg  Pulse 91  Temp(Src) 98.3 F (36.8 C) (Oral)  Resp 20  Ht 5\' 7"  (1.702 m)  Wt 190 lb (86.183 kg)  BMI 29.75 kg/m2  SpO2 100%  LMP 11/05/2015  Vital signs normal   Physical Exam  Constitutional: She is oriented to person, place, and time. She appears well-developed and well-nourished.  Non-toxic appearance. She does not appear ill. She appears distressed.  HENT:  Head: Normocephalic and atraumatic.  Right Ear: External ear normal.  Left Ear: External ear normal.  Nose: Nose normal. No mucosal edema or rhinorrhea.  Mouth/Throat: Oropharynx is clear and moist and mucous membranes are normal. No dental abscesses or uvula swelling.  Eyes: Conjunctivae and EOM are normal. Pupils are equal, round, and reactive  to light.  Very photophobic  Neck: Normal range of motion and full passive range of motion without pain. Neck supple.  Moves head freely during course of conversation  Cardiovascular: Normal rate, regular rhythm and normal heart sounds.  Exam reveals no gallop and no friction rub.   No murmur heard. Pulmonary/Chest: Effort normal and breath sounds normal. No respiratory distress. She has no wheezes. She has no rhonchi. She has no rales. She exhibits no tenderness and no  crepitus.  Abdominal: Soft. Normal appearance and bowel sounds are normal. She exhibits no distension. There is no tenderness. There is no rebound and no guarding.  Musculoskeletal: Normal range of motion. She exhibits no edema or tenderness.  Moves all extremities well.   Neurological: She is alert and oriented to person, place, and time. She has normal strength. No cranial nerve deficit.  Skin: Skin is warm, dry and intact. No rash noted. No erythema. No pallor.  Psychiatric: Her speech is normal. She is slowed.  tearful  Nursing note and vitals reviewed.   ED Course  Procedures (including critical care time)  Medications  0.9 %  sodium chloride infusion (0 mLs Intravenous Stopped 11/09/15 0400)    Followed by  0.9 %  sodium chloride infusion (0 mLs Intravenous Stopped 11/09/15 0526)    Followed by  0.9 %  sodium chloride infusion (1,000 mLs Intravenous New Bag/Given 11/09/15 0701)  dextromethorphan-guaiFENesin (MUCINEX DM) 30-600 MG per 12 hr tablet 1 tablet (1 tablet Oral Given 11/09/15 0551)  dihydroergotamine (DHE) injection 0.5 mg (not administered)  sodium chloride 0.9 % bolus 1,000 mL (not administered)  metoCLOPramide (REGLAN) injection 10 mg (10 mg Intravenous Given 11/09/15 0238)  diphenhydrAMINE (BENADRYL) injection 25 mg (25 mg Intravenous Given 11/09/15 0238)  dexamethasone (DECADRON) injection 10 mg (10 mg Intravenous Given 11/09/15 0409)  magnesium sulfate IVPB 2 g 50 mL (0 g Intravenous Stopped 11/09/15 0526)  valproate (DEPACON) 1,000 mg in dextrose 5 % 50 mL IVPB (0 mg Intravenous Stopped 11/09/15 0659)  prochlorperazine (COMPAZINE) injection 10 mg (10 mg Intravenous Given 11/09/15 0705)    Patient was started on migraine cocktail, with reglan and benadryl,  her headache sounds very typical for migraine headache although she does have a history of them.  Recheck at 3:50 AM patient still having headache. She was given magnesium sulfate 2 g IV and Decadron  IV.  Patient was rechecked at 5 AM. She states her headache starting to improve. She was given the results of her CT scan and her chest x-ray. Further meds were ordered, depacon.   07:00 pt states her HA is now only on the left side. Compazine ordered.   07:45 still having a left sided headache, better, but still worse than a typical headache. ZOX09 ordered.  08:10 Dr Juleen China will recheck patient after DHE.   Labs Review Labs Reviewed - No data to display  Imaging Review Dg Chest 2 View  11/09/2015  CLINICAL DATA:  Acute onset of fever, and body aches and pains. Headache. Initial encounter. EXAM: CHEST  2 VIEW COMPARISON:  Chest radiograph performed 07/28/2015 FINDINGS: The lungs are well-aerated and clear. There is no evidence of focal opacification, pleural effusion or pneumothorax. The heart is normal in size; the mediastinal contour is within normal limits. No acute osseous abnormalities are seen. IMPRESSION: No acute cardiopulmonary process seen. Electronically Signed   By: Roanna Raider M.D.   On: 11/09/2015 04:32   Ct Head Wo Contrast  11/09/2015  CLINICAL DATA:  Acute  onset of fever, and diffuse body aches and pains. Headache. Initial encounter. EXAM: CT HEAD WITHOUT CONTRAST TECHNIQUE: Contiguous axial images were obtained from the base of the skull through the vertex without intravenous contrast. COMPARISON:  CT of the head performed 12/02/2012 FINDINGS: There is no evidence of acute infarction, mass lesion, or intra- or extra-axial hemorrhage on CT. The posterior fossa, including the cerebellum, brainstem and fourth ventricle, is within normal limits. The third and lateral ventricles, and basal ganglia are unremarkable in appearance. The cerebral hemispheres are symmetric in appearance, with normal gray-white differentiation. No mass effect or midline shift is seen. There is no evidence of fracture; visualized osseous structures are unremarkable in appearance. The orbits are within  normal limits. The paranasal sinuses and mastoid air cells are well-aerated. No significant soft tissue abnormalities are seen. IMPRESSION: Unremarkable noncontrast CT of the head. Electronically Signed   By: Roanna Raider M.D.   On: 11/09/2015 04:33   I have personally reviewed and evaluated these images and lab results as part of my medical decision-making.   EKG Interpretation None      MDM   Final diagnoses:  Migraine without aura and without status migrainosus, not intractable  Bronchitis    New Prescriptions   AZITHROMYCIN (ZITHROMAX) 250 MG TABLET    Take 2 po the first day then once a day for the next 4 days.   ISOMETHEPTENE-ACETAMINOPHEN-DICHLORALPHENAZONE (MIDRIN) 65-100-325 MG CAPSULE    Take 1 capsule by mouth 4 (four) times daily as needed for migraine. Maximum 5 capsules in 12 hours for migraine headaches, 8 capsules in 24 hours for tension headaches.   ONDANSETRON (ZOFRAN) 4 MG TABLET    Take 1 tablet (4 mg total) by mouth every 8 (eight) hours as needed for nausea or vomiting.    Plan discharge  Devoria Albe, MD, Concha Pyo, MD 11/09/15 [phone]

## 2015-11-09 NOTE — ED Notes (Signed)
Patient with no complaints at this time. Respirations even and unlabored. Skin warm/dry. Discharge instructions reviewed with patient at this time. Patient given opportunity to voice concerns/ask questions. IV removed per policy and band-aid applied to site. Patient discharged at this time and left Emergency Department with steady gait.  

## 2016-01-01 MED FILL — SUBOXONE 8 MG-2 MG SL FILM: 8-2 | 7 days supply | Qty: 14 | Fill #0

## 2016-01-08 MED FILL — SUBOXONE 8 MG-2 MG SL FILM: 8-2 | 7 days supply | Qty: 14 | Fill #0

## 2016-01-22 MED FILL — SUBOXONE 8 MG-2 MG SL FILM: 8-2 | 7 days supply | Qty: 14 | Fill #0

## 2016-02-19 ENCOUNTER — Encounter (HOSPITAL_COMMUNITY): Payer: Self-pay | Admitting: Emergency Medicine

## 2016-02-19 ENCOUNTER — Emergency Department (HOSPITAL_COMMUNITY)
Admission: EM | Admit: 2016-02-19 | Discharge: 2016-02-19 | Disposition: A | Discharge status: Home / Self Care | Payer: Medicaid Other | Attending: Emergency Medicine | Admitting: Emergency Medicine

## 2016-02-19 ENCOUNTER — Emergency Department (HOSPITAL_COMMUNITY): Payer: Medicaid Other

## 2016-02-19 DIAGNOSIS — J40 Bronchitis, not specified as acute or chronic: Secondary | ICD-10-CM | POA: Insufficient documentation

## 2016-02-19 DIAGNOSIS — F17200 Nicotine dependence, unspecified, uncomplicated: Secondary | ICD-10-CM | POA: Diagnosis not present

## 2016-02-19 DIAGNOSIS — R05 Cough: Secondary | ICD-10-CM | POA: Diagnosis present

## 2016-02-19 MED ORDER — ALBUTEROL SULFATE HFA 108 (90 BASE) MCG/ACT IN AERS: 1.0000 | INHALATION_SPRAY | Freq: Four times a day (QID) | RESPIRATORY_TRACT | Status: AC | PRN

## 2016-02-19 MED ORDER — AZITHROMYCIN 250 MG PO TABS
ORAL_TABLET | ORAL | Status: DC
Start: 2016-02-19 — End: 2016-05-14

## 2016-02-19 NOTE — ED Notes (Signed)
PT c/o nasal congestion, productive yellow sputum cough and body aches x4 days. PT reports taking aleve and dayquil at 1000 today.

## 2016-02-19 NOTE — Discharge Instructions (Signed)
Metered Dose Inhaler (No Spacer Used) Inhaled medicines are the basis of treatment for asthma and other breathing problems. Inhaled medicine can only be effective if used properly. Good technique assures that the medicine reaches the lungs. Metered dose inhalers (MDIs) are used to deliver a variety of inhaled medicines. These include quick relief or rescue medicines (such as bronchodilators) and controller medicines (such as corticosteroids). The medicine is delivered by pushing down on a metal canister to release a set amount of spray. If you are using different kinds of inhalers, use your quick relief medicine to open the airways 10-15 minutes before using a steroid, if instructed to do so by your health care provider. If you are unsure which inhalers to use and the order of using them, ask your health care provider, nurse, or respiratory therapist. HOW TO USE THE INHALER  Remove the cap from the inhaler.  If you are using the inhaler for the first time, you will need to prime it. Shake the inhaler for 5 seconds and release four puffs into the air, away from your face. Ask your health care provider or pharmacist if you have questions about priming your inhaler.  Shake the inhaler for 5 seconds before each breath in (inhalation).  Position the inhaler so that the top of the canister faces up.  Put your index finger on the top of the medicine canister. Your thumb supports the bottom of the inhaler.  Open your mouth.  Either place the inhaler between your teeth and place your lips tightly around the mouthpiece, or hold the inhaler 1-2 inches away from your open mouth. If you are unsure of which technique to use, ask your health care provider.  Breathe out (exhale) normally and as completely as possible.  Press the canister down with the index finger to release the medicine.  At the same time as the canister is pressed, inhale deeply and slowly until your lungs are completely filled. This  should take 4-6 seconds. Keep your tongue down.  Hold the medicine in your lungs for 5-10 seconds (10 seconds is best). This helps the medicine get into the small airways of your lungs.  Breathe out slowly, through pursed lips. Whistling is an example of pursed lips.  Wait at least 1 minute between puffs. Continue with the above steps until you have taken the number of puffs your health care provider has ordered. Do not use the inhaler more than your health care provider directs you to.  Replace the cap on the inhaler.  Follow the directions from your health care provider or the inhaler insert for cleaning the inhaler. If you are using a steroid inhaler, after your last puff, rinse your mouth with water, gargle, and spit out the water. Do not swallow the water. AVOID:  Inhaling before or after starting the spray of medicine. It takes practice to coordinate your breathing with triggering the spray.  Inhaling through the nose (rather than the mouth) when triggering the spray. HOW TO DETERMINE IF YOUR INHALER IS FULL OR NEARLY EMPTY You cannot know when an inhaler is empty by shaking it. Some inhalers are now being made with dose counters. Ask your health care provider for a prescription that has a dose counter if you feel you need that extra help. If your inhaler does not have a counter, ask your health care provider to help you determine the date you need to refill your inhaler. Write the refill date on a calendar or your inhaler canister. Refill  your inhaler 7-10 days before it runs out. Be sure to keep an adequate supply of medicine. This includes making sure it has not expired, and making sure you have a spare inhaler. SEEK MEDICAL CARE IF:  Symptoms are only partially relieved with your inhaler.  You are having trouble using your inhaler.  You experience an increase in phlegm. SEEK IMMEDIATE MEDICAL CARE IF:  You feel little or no relief with your inhalers. You are still wheezing and  feeling shortness of breath, tightness in your chest, or both.  You have dizziness, headaches, or a fast heart rate.  You have chills, fever, or night sweats.  There is a noticeable increase in phlegm production, or there is blood in the phlegm. MAKE SURE YOU:  Understand these instructions.  Will watch your condition.  Will get help right away if you are not doing well or get worse.   This information is not intended to replace advice given to you by your health care provider. Make sure you discuss any questions you have with your health care provider.   Document Released: 08/28/2007 Document Revised: 11/21/2014 Document Reviewed: 04/18/2013 Elsevier Interactive Patient Education 2016 Elsevier Inc[employer] Acute Bronchitis Bronchitis is inflammation of the airways that extend from the windpipe into the lungs (bronchi). The inflammation often causes mucus to develop. This leads to a cough, which is the most common symptom of bronchitis.  In acute bronchitis, the condition usually develops suddenly and goes away over time, usually in a couple weeks. Smoking, allergies, and asthma can make bronchitis worse. Repeated episodes of bronchitis may cause further lung problems.  CAUSES Acute bronchitis is most often caused by the same virus that causes a cold. The virus can spread from person to person (contagious) through coughing, sneezing, and touching contaminated objects. SIGNS AND SYMPTOMS   Cough.   Fever.   Coughing up mucus.   Body aches.   Chest congestion.   Chills.   Shortness of breath.   Sore throat.  DIAGNOSIS  Acute bronchitis is usually diagnosed through a physical exam. Your health care provider will also ask you questions about your medical history. Tests, such as chest X-rays, are sometimes done to rule out other conditions.  TREATMENT  Acute bronchitis usually goes away in a couple weeks. Oftentimes, no medical treatment is necessary. Medicines are sometimes  given for relief of fever or cough. Antibiotic medicines are usually not needed but may be prescribed in certain situations. In some cases, an inhaler may be recommended to help reduce shortness of breath and control the cough. A cool mist vaporizer may also be used to help thin bronchial secretions and make it easier to clear the chest.  HOME CARE INSTRUCTIONS  Get plenty of rest.   Drink enough fluids to keep your urine clear or pale yellow (unless you have a medical condition that requires fluid restriction). Increasing fluids may help thin your respiratory secretions (sputum) and reduce chest congestion, and it will prevent dehydration.   Take medicines only as directed by your health care provider.  If you were prescribed an antibiotic medicine, finish it all even if you start to feel better.  Avoid smoking and secondhand smoke. Exposure to cigarette smoke or irritating chemicals will make bronchitis worse. If you are a smoker, consider using nicotine gum or skin patches to help control withdrawal symptoms. Quitting smoking will help your lungs heal faster.   Reduce the chances of another bout of acute bronchitis by washing your hands frequently, avoiding people  with cold symptoms, and trying not to touch your hands to your mouth, nose, or eyes.   Keep all follow-up visits as directed by your health care provider.  SEEK MEDICAL CARE IF: Your symptoms do not improve after 1 week of treatment.  SEEK IMMEDIATE MEDICAL CARE IF:  You develop an increased fever or chills.   You have chest pain.   You have severe shortness of breath.  You have bloody sputum.   You develop dehydration.  You faint or repeatedly feel like you are going to pass out.  You develop repeated vomiting.  You develop a severe headache. MAKE SURE YOU:   Understand these instructions.  Will watch your condition.  Will get help right away if you are not doing well or get worse.   This information  is not intended to replace advice given to you by your health care provider. Make sure you discuss any questions you have with your health care provider.   Document Released: 12/08/2004 Document Revised: 11/21/2014 Document Reviewed: 04/23/2013 Elsevier Interactive Patient Education [employer]

## 2016-02-19 NOTE — ED Provider Notes (Signed)
CSN: [id number]     Arrival date & time 02/19/16  1317 History   None    Chief Complaint  Patient presents with  . Cough     (Consider location/radiation/quality/duration/timing/severity/associated sxs/prior Treatment) Patient is a 25 y.o. female presenting with cough. The history is provided by the patient. No language interpreter was used.  Cough Cough characteristics:  Productive Sputum characteristics:  Nondescript Severity:  Moderate Onset quality:  Gradual Duration:  4 days Timing:  Constant Progression:  Partially resolved Chronicity:  New Context: upper respiratory infection   Relieved by:  Nothing Worsened by:  Nothing tried Ineffective treatments:  None tried Associated symptoms: wheezing   Risk factors: no recent infection     Past Medical History  Diagnosis Date  . Bronchitis    Past Surgical History  Procedure Laterality Date  . Cesarean section     History reviewed. No pertinent family history. Social History  Substance Use Topics  . Smoking status: Current Every Day Smoker -- 1.00 packs/day  . Smokeless tobacco: None  . Alcohol Use: No   OB History    Gravida Para Term Preterm AB TAB SAB Ectopic Multiple Living   2         2     Review of Systems  Respiratory: Positive for cough and wheezing.   All other systems reviewed and are negative.     Allergies  Review of patient's allergies indicates no known allergies.  Home Medications   Prior to Admission medications   Medication Sig Start Date End Date Taking? Authorizing Provider  albuterol (PROVENTIL HFA;VENTOLIN HFA) 108 (90 Base) MCG/ACT inhaler Inhale 1-2 puffs into the lungs every 6 (six) hours as needed for wheezing or shortness of breath. 02/19/16   Elson Areas, PA-C  azithromycin (ZITHROMAX) 250 MG tablet Take 2 po the first day then once a day for the next 4 days. 02/19/16   Elson Areas, PA-C  guaiFENesin-codeine [facility]) 100-10 MG/5ML syrup Take 10 mLs by mouth 3 (three)  times daily as needed for cough. 07/28/15   Tammy Triplett, PA-C  ibuprofen (ADVIL,MOTRIN) 200 MG tablet Take 600 mg by mouth every 6 (six) hours as needed for headache or moderate pain.    Historical Provider, MD  isometheptene-acetaminophen-dichloralphenazone (MIDRIN) [phone] MG capsule Take 1 capsule by mouth 4 (four) times daily as needed for migraine. Maximum 5 capsules in 12 hours for migraine headaches, 8 capsules in 24 hours for tension headaches. 11/09/15   Devoria Albe, MD  levonorgestrel (MIRENA) 20 MCG/24HR IUD 1 each by Intrauterine route once.    Historical Provider, MD  ondansetron (ZOFRAN) 4 MG tablet Take 1 tablet (4 mg total) by mouth every 8 (eight) hours as needed for nausea or vomiting. 11/09/15   Devoria Albe, MD   BP 151/86 mmHg  Pulse 83  Temp(Src) 98.2 F (36.8 C) (Oral)  Resp 18  Ht  (1.702 m)  Wt 86.183 kg  BMI 29.75 kg/m2  SpO2 97%  LMP 02/17/2016 Physical Exam  Constitutional: She is oriented to person, place, and time. She appears well-developed and well-nourished.  HENT:  Head: Normocephalic and atraumatic.  Right Ear: External ear normal.  Left Ear: External ear normal.  Mouth/Throat: Oropharynx is clear and moist.  Eyes: Conjunctivae and EOM are normal. Pupils are equal, round, and reactive to light.  Neck: Normal range of motion.  Cardiovascular: Normal rate and normal heart sounds.   Pulmonary/Chest: Effort normal.  Slight wheezing bases  Abdominal: She exhibits  no distension.  Musculoskeletal: Normal range of motion.  Neurological: She is alert and oriented to person, place, and time.  Skin: Skin is warm.  Psychiatric: She has a normal mood and affect.  Nursing note and vitals reviewed.   ED Course  Procedures (including critical care time) Labs Review Labs Reviewed - No data to display  Imaging Review Dg Chest 2 View  02/19/2016  CLINICAL DATA:  Cough, congestion and fever for 4 days, smoker, childhood asthma EXAM: CHEST  2 VIEW  COMPARISON:  11/09/2015 FINDINGS: Normal heart size, mediastinal contours, and pulmonary vascularity. Lungs clear. No pleural effusion or pneumothorax. Bones unremarkable. IMPRESSION: Normal exam. Electronically Signed   By: Ulyses SouthwardMark  Boles M.D.   On: 02/19/2016 13:55   I have personally reviewed and evaluated these images and lab results as part of my medical decision-making.   EKG Interpretation None      MDM pt reports she has used albuterol in the past.  Pt reports a history of wheezing with uri and exercise   Final diagnoses:  Bronchitis    zithromax Albuterol inhaler    Elson AreasLeslie K Sofia, PA-C 02/19/16 1443  Samuel JesterKathleen McManus, DO 02/22/16 [id number]

## 2016-02-22 MED FILL — IBUPROFEN 800 MG TABLET: 800 | 5 days supply | Qty: 16 | Fill #0

## 2016-02-23 MED FILL — SUBOXONE 8 MG-2 MG SL FILM: 8-2 | 28 days supply | Qty: 70 | Fill #0

## 2016-03-24 MED FILL — SUBOXONE 8 MG-2 MG SL FILM: 8-2 | 28 days supply | Qty: 70 | Fill #0

## 2016-04-25 MED FILL — SUBOXONE 8 MG-2 MG SL FILM: 8-2 | 28 days supply | Qty: 70 | Fill #0

## 2016-05-14 ENCOUNTER — Encounter (HOSPITAL_COMMUNITY): Payer: Self-pay | Admitting: Emergency Medicine

## 2016-05-14 ENCOUNTER — Emergency Department (HOSPITAL_COMMUNITY): Payer: Medicaid Other

## 2016-05-14 ENCOUNTER — Emergency Department (HOSPITAL_COMMUNITY)
Admission: EM | Admit: 2016-05-14 | Discharge: 2016-05-14 | Disposition: A | Discharge status: Home / Self Care | Payer: Medicaid Other | Attending: Emergency Medicine | Admitting: Emergency Medicine

## 2016-05-14 DIAGNOSIS — M778 Other enthesopathies, not elsewhere classified: Secondary | ICD-10-CM | POA: Diagnosis not present

## 2016-05-14 DIAGNOSIS — M25531 Pain in right wrist: Secondary | ICD-10-CM | POA: Diagnosis present

## 2016-05-14 DIAGNOSIS — F17210 Nicotine dependence, cigarettes, uncomplicated: Secondary | ICD-10-CM | POA: Diagnosis not present

## 2016-05-14 DIAGNOSIS — Z79899 Other long term (current) drug therapy: Secondary | ICD-10-CM | POA: Insufficient documentation

## 2016-05-14 DIAGNOSIS — Z791 Long term (current) use of non-steroidal anti-inflammatories (NSAID): Secondary | ICD-10-CM | POA: Diagnosis not present

## 2016-05-14 MED ORDER — HYDROCODONE-ACETAMINOPHEN 5-325 MG PO TABS
2.0000 | ORAL_TABLET | Freq: Once | ORAL | Status: AC
  Administered 2016-05-14: 2 via ORAL
  Filled 2016-05-14: qty 2

## 2016-05-14 MED ORDER — HYDROCODONE-ACETAMINOPHEN 5-325 MG PO TABS: 2.0000 | ORAL_TABLET | ORAL | Status: DC | PRN

## 2016-05-14 MED ORDER — IBUPROFEN 800 MG PO TABS: 800.0000 mg | ORAL_TABLET | Freq: Three times a day (TID) | ORAL | Status: DC

## 2016-05-14 NOTE — ED Provider Notes (Signed)
CSN: [id number]     Arrival date & time 05/14/16  1846 History   First MD Initiated Contact with Patient 05/14/16 1937     Chief Complaint  Patient presents with  . Wrist Pain     (Consider location/radiation/quality/duration/timing/severity/associated sxs/prior Treatment) Patient is a 25 y.o. female presenting with wrist pain. The history is provided by the patient. No language interpreter was used.  Wrist Pain This is a new problem. The current episode started yesterday. The problem occurs constantly. The problem has been gradually worsening. Pertinent negatives include no weakness. Nothing aggravates the symptoms. The treatment provided no relief.  Pt complains of throbbing to right wrist.  Pt reports she began having pain yesterday morning.  Pain is worse   Past Medical History  Diagnosis Date  . Bronchitis    Past Surgical History  Procedure Laterality Date  . Cesarean section     History reviewed. No pertinent family history. Social History  Substance Use Topics  . Smoking status: Current Every Day Smoker -- 1.00 packs/day    Types: Cigarettes  . Smokeless tobacco: None  . Alcohol Use: No   OB History    Gravida Para Term Preterm AB TAB SAB Ectopic Multiple Living   2         2     Review of Systems  Neurological: Negative for weakness.  All other systems reviewed and are negative.     Allergies  Review of patient's allergies indicates no known allergies.  Home Medications   Prior to Admission medications   Medication Sig Start Date End Date Taking? Authorizing Provider  albuterol (PROVENTIL HFA;VENTOLIN HFA) 108 (90 Base) MCG/ACT inhaler Inhale 1-2 puffs into the lungs every 6 (six) hours as needed for wheezing or shortness of breath. 02/19/16  Yes Lonia SkinnerLeslie K Tracey Hermance, PA-C  ibuprofen (ADVIL,MOTRIN) 200 MG tablet Take 600 mg by mouth every 6 (six) hours as needed for headache or moderate pain.   Yes Historical Provider, MD  levonorgestrel (MIRENA) 20 MCG/24HR IUD 1  each by Intrauterine route once.   Yes Historical Provider, MD   BP 141/94 mmHg  Pulse 86  Temp(Src) 97.9 F (36.6 C) (Oral)  Resp 14  Ht 5\' 8"  (1.727 m)  Wt 83.008 kg  BMI 27.83 kg/m2  SpO2 100%  LMP 05/04/2016 Physical Exam  Constitutional: She appears well-developed and well-nourished.  Musculoskeletal: She exhibits tenderness.  Tender right wrist,  Pain with pronation and supination,  nv and ns intact  Neurological: She is alert.  Skin: Skin is warm.  Psychiatric: She has a normal mood and affect.  Vitals reviewed.   ED Course  Procedures (including critical care time) Labs Review Labs Reviewed - No data to display  Imaging Review Dg Wrist Complete Right  05/14/2016  CLINICAL DATA:  Right wrist pain beginning today.  No injury. EXAM: RIGHT WRIST - COMPLETE 3+ VIEW COMPARISON:  None. FINDINGS: There is no evidence of fracture or dislocation. There is no evidence of arthropathy or other focal bone abnormality. Soft tissues are unremarkable. IMPRESSION: Negative. Electronically Signed   By: Elberta Fortisaniel  Boyle M.D.   On: 05/14/2016 20:15   I have personally reviewed and evaluated these images and lab results as part of my medical decision-making.   EKG Interpretation None      MDM I suspect pt has tendonitis,  Pt advised to see Dr. Izora Ribasoley for evaluation if pain persist   Final diagnoses:  Tendonitis of wrist, right    Wrist splint Meds ordered  this encounter  Medications  . HYDROcodone-acetaminophen (NORCO/VICODIN) 5-325 MG per tablet 2 tablet    Sig:   . HYDROcodone-acetaminophen (NORCO/VICODIN) 5-325 MG tablet    Sig: Take 2 tablets by mouth every 4 (four) hours as needed.    Dispense:  10 tablet    Refill:  0    Order Specific Question:  Supervising Provider    Answer:  Hyacinth MeekerMILLER, BRIAN [[id number]]  . ibuprofen (ADVIL,MOTRIN) 800 MG tablet    Sig: Take 1 tablet (800 mg total) by mouth 3 (three) times daily.    Dispense:  21 tablet    Refill:  0    Order Specific  Question:  Supervising Provider    Answer:  Eber HongMILLER, BRIAN [[id number]]  An After Visit Summary was printed and given to the patient.    Elson AreasLeslie K Kaho Selle, PA-C 05/14/16 2112  Bethann BerkshireJoseph Zammit, MD 05/15/16 [phone]

## 2016-05-14 NOTE — ED Notes (Signed)
Pt c/o right wrist pain with no injury since yesterday.

## 2016-05-14 NOTE — Discharge Instructions (Signed)
De Quervain Tenosynovitis °Tendons attach muscles to bones. They also help with joint movements. When tendons become irritated or swollen, it is called tendinitis. °The extensor pollicis brevis (EPB) tendon connects the EPB muscle to a bone that is near the base of the thumb. The EPB muscle helps to straighten and extend the thumb. De Quervain tenosynovitis is a condition in which the EPB tendon lining (sheath) becomes irritated, thickened, and swollen. This condition is sometimes called stenosing tenosynovitis. This condition causes pain on the thumb side of the back of the wrist. °CAUSES °Causes of this condition include: °· Activities that repeatedly cause your thumb and wrist to extend. °· A sudden increase in activity or change in activity that affects your wrist. °RISK FACTORS: °This condition is more likely to develop in: °· Females. °· People who have diabetes. °· Women who have recently given birth. °· People who are over 40 years of age. °· People who do activities that involve repeated hand and wrist motions, such as tennis, racquetball, volleyball, gardening, and taking care of children. °· People who do heavy labor. °· People who have poor wrist strength and flexibility. °· People who do not warm up properly before activities. °SYMPTOMS °Symptoms of this condition include: °· Pain or tenderness over the thumb side of the back of the wrist when your thumb and wrist are not moving. °· Pain that gets worse when you straighten your thumb or extend your thumb or wrist. °· Pain when the injured area is touched. °· Locking or catching of the thumb joint while you bend and straighten your thumb. °· Decreased thumb motion due to pain. °· Swelling over the affected area. °DIAGNOSIS °This condition is diagnosed with a medical history and physical exam. Your health care provider will ask for details about your injury and ask about your symptoms. °TREATMENT °Treatment may include the use of icing and medicines to  reduce pain and swelling. You may also be advised to wear a splint or brace to limit your thumb and wrist motion. In less severe cases, treatment may also include working with a physical therapist to strengthen your wrist and calm the irritation around your EPB tendon sheath. In severe cases, surgery may be needed. °HOME CARE INSTRUCTIONS °If You Have a Splint or Brace: °· Wear it as told by your health care provider. Remove it only as told by your health care provider. °· Loosen the splint or brace if your fingers become numb and tingle, or if they turn cold and blue. °· Keep the splint or brace clean and dry. °Managing Pain, Stiffness, and Swelling  °· If directed, apply ice to the injured area. °¨ Put ice in a plastic bag. °¨ Place a towel between your skin and the bag. °¨ Leave the ice on for 20 minutes, 2-3 times per day. °· Move your fingers often to avoid stiffness and to lessen swelling. °· Raise (elevate) the injured area above the level of your heart while you are sitting or lying down. °General Instructions °· Return to your normal activities as told by your health care provider. Ask your health care provider what activities are safe for you. °· Take over-the-counter and prescription medicines only as told by your health care provider. °· Keep all follow-up visits as told by your health care provider. This is important. °· Do not drive or operate heavy machinery while taking prescription pain medicine. °SEEK MEDICAL CARE IF: °· Your pain, tenderness, or swelling gets worse, even if you have had   treatment. °· You have numbness or tingling in your wrist, hand, or fingers on the injured side. °  °This information is not intended to replace advice given to you by your health care provider. Make sure you discuss any questions you have with your health care provider. °  °Document Released: 10/31/2005 Document Revised: 07/22/2015 Document Reviewed: 01/06/2015 °Elsevier Interactive Patient Education ©2016  Elsevier Inc. ° °

## 2016-05-14 NOTE — ED Notes (Signed)
Pt returned demonstration on use of wrist splint. Pt has intact sensation, cap refill <3 seconds, and reports no new pain after application. Ambulated with steady and even gait at this time to lobby.

## 2016-05-15 NOTE — ED Provider Notes (Signed)
10:19 AM  I received a call from the local pharmacy concerning the prescription that was written for Norco and ibuprofen on July 1 for wrist pain. The pharmacist informs me that the [facility]. My review of the records does not show that the patient identified herself as a Suboxone patient. I have given the pharmacy permission to cancel the narcotic medication, and asked the patient to see her physicians at the Suboxone clinic for additional pain or management. The prescription for the ibuprofen was granted for the patient to use until [facility]  Ivery QualeHobson Candas Deemer, PA-C 05/15/16 1022  Eber HongBrian Miller, MD 05/15/16 [phone]

## 2016-05-22 ENCOUNTER — Other Ambulatory Visit: Payer: Self-pay

## 2016-05-22 ENCOUNTER — Emergency Department (HOSPITAL_COMMUNITY)
Admission: EM | Admit: 2016-05-22 | Discharge: 2016-05-23 | Disposition: A | Discharge status: Home / Self Care | Payer: Medicaid Other | Attending: Emergency Medicine | Admitting: Emergency Medicine

## 2016-05-22 ENCOUNTER — Encounter (HOSPITAL_COMMUNITY): Payer: Self-pay

## 2016-05-22 DIAGNOSIS — F17210 Nicotine dependence, cigarettes, uncomplicated: Secondary | ICD-10-CM | POA: Insufficient documentation

## 2016-05-22 DIAGNOSIS — Z79899 Other long term (current) drug therapy: Secondary | ICD-10-CM | POA: Insufficient documentation

## 2016-05-22 DIAGNOSIS — F410 Panic disorder [episodic paroxysmal anxiety] without agoraphobia: Secondary | ICD-10-CM | POA: Insufficient documentation

## 2016-05-22 DIAGNOSIS — R002 Palpitations: Secondary | ICD-10-CM | POA: Diagnosis not present

## 2016-05-22 DIAGNOSIS — R61 Generalized hyperhidrosis: Secondary | ICD-10-CM | POA: Insufficient documentation

## 2016-05-22 DIAGNOSIS — M25511 Pain in right shoulder: Secondary | ICD-10-CM | POA: Insufficient documentation

## 2016-05-22 DIAGNOSIS — Z791 Long term (current) use of non-steroidal anti-inflammatories (NSAID): Secondary | ICD-10-CM | POA: Insufficient documentation

## 2016-05-22 DIAGNOSIS — M79601 Pain in right arm: Secondary | ICD-10-CM | POA: Insufficient documentation

## 2016-05-22 DIAGNOSIS — R42 Dizziness and giddiness: Secondary | ICD-10-CM | POA: Diagnosis not present

## 2016-05-22 DIAGNOSIS — R200 Anesthesia of skin: Secondary | ICD-10-CM | POA: Insufficient documentation

## 2016-05-22 NOTE — ED Notes (Signed)
I was at work and started feeling weird, nauseated.  Felt like a panic attack.  I was already in pain with my tendonitis and I have been working with it and now my shoulder hurts.  I have anxiety and I have had panic attacks before.  I was dizzy and hot per pt.  I did chew a piece of energy gum around 1 hour before.

## 2016-05-22 NOTE — ED Provider Notes (Signed)
CSN: [id number]     Arrival date & time 05/22/16  2117 History  By signing my name below, I, Majel Homer, attest that this documentation has been prepared under the direction and in the presence of Devoria Albe, MD at 23:55 PM. Electronically Signed: Majel Homer, Scribe. 05/23/2016. 1:11 AM.   Chief Complaint  Patient presents with  . Panic Attack   The history is provided by the patient. No language interpreter was used.   HPI Comments: Kaylee Short is a 25 y.o. female who presents to the Emergency Department complaining of gradually worsening, constant, right arm and shoulder pain that began a few weeks ago and worsened today. She states associated numbness in her right hand that is present all the time; pt notes her pain sometimes wakes her up from sleep. Pt reports she was seen in the ED on 7/1 in which she was diagnosed with tendonitis in her right wrist; she states she was given a velcro wrist splint and has experienced moderate relief from numbness. Pt now complains of constatnt, right shoulder pain that began 1 day ago. She notes her pain is exacerbated with most movements; she gives an example of pulling her pants up after using the restroom. She states she has taken  ibuprofen with no relief. She states she has also been using ice packs. Pt states she became hot with associated heart racing, diaphoresis, and dizziness at work today around 8:00pm tonight. She states she currently feels anxious in the ED. She suspects she was having a panic attack. She notes she does not see anyone for her anxiety and has never been diagnosed with anxiety, although she has had anxiety; she states she just "tries to deal with it herself." Pt also reports she ate one piece of Jolt energy gum with soda before the onset of her symptoms. She notes this was the first time she ever chewed this gum.   She states she is waiting to see her PCP to get a referral to the orthopedist as recommended in the ED her last  visit.  PCP [facility] in [place]  Past Medical History  Diagnosis Date  . Bronchitis    Past Surgical History  Procedure Laterality Date  . Cesarean section     No family history on file. Social History  Substance Use Topics  . Smoking status: Current Every Day Smoker -- 1.00 packs/day    Types: Cigarettes  . Smokeless tobacco: None  . Alcohol Use: No   employed  OB History    Gravida Para Term Preterm AB TAB SAB Ectopic Multiple Living   2         2     Review of Systems  Constitutional: Positive for diaphoresis.  Cardiovascular: Positive for palpitations.  Musculoskeletal: Positive for joint swelling and arthralgias.  Neurological: Positive for dizziness and numbness.  Psychiatric/Behavioral: Positive for sleep disturbance.  All other systems reviewed and are negative.  Allergies  Review of patient's allergies indicates no known allergies.  Home Medications   Prior to Admission medications   Medication Sig Start Date End Date Taking? Authorizing Provider  albuterol (PROVENTIL HFA;VENTOLIN HFA) 108 (90 Base) MCG/ACT inhaler Inhale 1-2 puffs into the lungs every 6 (six) hours as needed for wheezing or shortness of breath. 02/19/16  Yes Lonia Skinner Sofia, PA-C  buprenorphine-naloxone (SUBOXONE) 8-2 MG SUBL SL tablet Place 1 tablet under the tongue daily.   Yes Historical Provider, MD  ibuprofen (ADVIL,MOTRIN) 800 MG tablet Take 1 tablet (800 mg total)  by mouth 3 (three) times daily. 05/14/16  Yes Elson AreasLeslie K Sofia, PA-C  levonorgestrel (MIRENA) 20 MCG/24HR IUD 1 each by Intrauterine route once.   Yes Historical Provider, MD  cyclobenzaprine (FLEXERIL) 5 MG tablet Take 1 tablet (5 mg total) by mouth 3 (three) times daily as needed (for muscle pain). 05/23/16   Devoria AlbeIva Daisean Brodhead, MD  HYDROcodone-acetaminophen (NORCO/VICODIN) 5-325 MG tablet Take 2 tablets by mouth every 4 (four) hours as needed. Patient not taking: Reported on 05/22/2016 05/14/16   Elson AreasLeslie K Sofia, PA-C   BP 134/90 mmHg  Pulse  91  Temp(Src) 98 F (36.7 C) (Oral)  Resp 20  Ht 5\' 8"  (1.727 m)  Wt 185 lb (83.915 kg)  BMI 28.14 kg/m2  SpO2 100%  LMP 05/04/2016  Vital signs normal   Physical Exam  Constitutional: She is oriented to person, place, and time. She appears well-developed and well-nourished.  Non-toxic appearance. She does not appear ill. No distress.  HENT:  Head: Normocephalic and atraumatic.  Right Ear: External ear normal.  Left Ear: External ear normal.  Nose: Nose normal. No mucosal edema or rhinorrhea.  Mouth/Throat: Mucous membranes are normal. No dental abscesses or uvula swelling.  Eyes: Conjunctivae and EOM are normal.  Neck: Normal range of motion and full passive range of motion without pain.  Cardiovascular: Normal rate.   Pulmonary/Chest: Effort normal. No respiratory distress. She has no rhonchi. She exhibits no crepitus.  Abdominal: Normal appearance.  Musculoskeletal: Normal range of motion. She exhibits tenderness. She exhibits no edema.       Arms: Tenderness on right trapezius and right shoulder joint with pain on abduction and forward and posterior flexion of her shoulder.  Neurological: She is alert and oriented to person, place, and time. She has normal strength. No cranial nerve deficit.  Skin: Skin is warm, dry and intact. No rash noted. No erythema. No pallor.  Psychiatric: She has a normal mood and affect. Her speech is normal and behavior is normal. Her mood appears not anxious.  Nursing note and vitals reviewed.   ED Course  Procedures     DIAGNOSTIC STUDIES:  Oxygen Saturation is 100% on RA, normal by my interpretation.    COORDINATION OF CARE:  12:11 AM Discussed treatment plan, which includes muscle relaxers with pt at bedside and pt agreed to plan. Patient was also referred to [facility] for evaluation of her anxiety. She was advised to avoid the energy drinks in the future.   Review of the [place] database shows patient gets #70 Suboxone 8/2  sublingual monthly from Dr. Tollie EthPlummer, last filled June 12  MDM   Final diagnoses:  Right shoulder pain  Panic attack   Discharge Medication List as of 05/23/2016  1:11 AM    START taking these medications   Details  cyclobenzaprine (FLEXERIL) 5 MG tablet Take 1 tablet (5 mg total) by mouth 3 (three) times daily as needed (for muscle pain)., Starting 05/23/2016, Until Discontinued, Print        Plan discharge  Devoria AlbeIva Jamin Panther, MD, FACEP   I personally performed the services described in this documentation, which was scribed in my presence. The recorded information has been reviewed and considered.  Devoria AlbeIva Eldean Klatt, MD, Concha PyoFACEP    Chelby Salata, MD 05/23/16 [phone]

## 2016-05-23 MED ORDER — CYCLOBENZAPRINE HCL 5 MG PO TABS: 5.0000 mg | ORAL_TABLET | Freq: Three times a day (TID) | ORAL | Status: DC | PRN

## 2016-05-23 MED FILL — CYCLOBENZAPRINE 5 MG TABLET: 5 | 10 days supply | Qty: 30 | Fill #0

## 2016-05-23 NOTE — Discharge Instructions (Signed)
Continue using ice and heat to your shoulder. Continue the ibuprofen. Add the flexeril for your shoulder pain. You need to follow up with your primary care doctor to get the referral to the orthopedist for further evaluation.  Consider going to [facility] if you continue to have problems with anxiety.    Cryotherapy Cryotherapy is when you put ice on your injury. Ice helps lessen pain and puffiness (swelling) after an injury. Ice works the best when you start using it in the first 24 to 48 hours after an injury. HOME CARE  Put a dry or damp towel between the ice pack and your skin.  You may press gently on the ice pack.  Leave the ice on for no more than 10 to 20 minutes at a time.  Check your skin after 5 minutes to make sure your skin is okay.  Rest at least 20 minutes between ice pack uses.  Stop using ice when your skin loses feeling (numbness).  Do not use ice on someone who cannot tell you when it hurts. This includes small children and people with memory problems (dementia). GET HELP RIGHT AWAY IF:  You have white spots on your skin.  Your skin turns blue or pale.  Your skin feels waxy or hard.  Your puffiness gets worse. MAKE SURE YOU:   Understand these instructions.  Will watch your condition.  Will get help right away if you are not doing well or get worse.   This information is not intended to replace advice given to you by your health care provider. Make sure you discuss any questions you have with your health care provider.   Document Released: 04/18/2008 Document Revised: 01/23/2012 Document Reviewed: 06/23/2011 Elsevier Interactive Patient Education 2016 Elsevier Inc.  [employer] Therapy Heat therapy can help ease sore, stiff, injured, and tight muscles and joints. Heat relaxes your muscles, which may help ease your pain. Heat therapy should only be used on old, pre-existing, or long-lasting (chronic) injuries. Do not use heat therapy unless told by your  doctor. HOW TO USE HEAT THERAPY There are several different kinds of heat therapy, including:  Moist heat pack.  Warm water bath.  Hot water bottle.  Electric heating pad.  Heated gel pack.  Heated wrap.  Electric heating pad. GENERAL HEAT THERAPY RECOMMENDATIONS   Do not sleep while using heat therapy. Only use heat therapy while you are awake.  Your skin may turn pink while using heat therapy. Do not use heat therapy if your skin turns red.  Do not use heat therapy if you have new pain.  High heat or long exposure to heat can cause burns. Be careful when using heat therapy to avoid burning your skin.  Do not use heat therapy on areas of your skin that are already irritated, such as with a rash or sunburn. GET HELP IF:   You have blisters, redness, swelling (puffiness), or numbness.  You have new pain.  Your pain is worse. MAKE SURE YOU:  Understand these instructions.  Will watch your condition.  Will get help right away if you are not doing well or get worse.   This information is not intended to replace advice given to you by your health care provider. Make sure you discuss any questions you have with your health care provider.   Document Released: 01/23/2012 Document Revised: 11/21/2014 Document Reviewed: 12/24/2013 Elsevier Interactive Patient Education 2016 Elsevier Inc.  Panic Attacks Panic attacks are sudden, short feelings of great fear or  discomfort. You may have them for no reason when you are relaxed, when you are uneasy (anxious), or when you are sleeping.  HOME CARE  Take all your medicines as told.  Check with your doctor before starting new medicines.  Keep all doctor visits. GET HELP IF:  You are not able to take your medicines as told.  Your symptoms do not get better.  Your symptoms get worse. GET HELP RIGHT AWAY IF:  Your attacks seem different than your normal attacks.  You have thoughts about hurting yourself or  others.  You take panic attack medicine and you have a side effect. MAKE SURE YOU:  Understand these instructions.  Will watch your condition.  Will get help right away if you are not doing well or get worse.   This information is not intended to replace advice given to you by your health care provider. Make sure you discuss any questions you have with your health care provider.   Document Released: 12/03/2010 Document Revised: 08/21/2013 Document Reviewed: 06/14/2013 Elsevier Interactive Patient Education [employer]2016 Elsevier Inc.

## 2016-05-24 MED FILL — SUBOXONE 8 MG-2 MG SL FILM: 8-2 | 28 days supply | Qty: 72 | Fill #0

## 2016-06-13 ENCOUNTER — Emergency Department (HOSPITAL_COMMUNITY)
Admission: EM | Admit: 2016-06-13 | Discharge: 2016-06-14 | Disposition: A | Discharge status: Home / Self Care | Payer: Medicaid Other | Attending: Emergency Medicine | Admitting: Emergency Medicine

## 2016-06-13 ENCOUNTER — Encounter (HOSPITAL_COMMUNITY): Payer: Self-pay | Admitting: Emergency Medicine

## 2016-06-13 DIAGNOSIS — F17210 Nicotine dependence, cigarettes, uncomplicated: Secondary | ICD-10-CM | POA: Diagnosis not present

## 2016-06-13 DIAGNOSIS — N939 Abnormal uterine and vaginal bleeding, unspecified: Secondary | ICD-10-CM | POA: Insufficient documentation

## 2016-06-13 DIAGNOSIS — R102 Pelvic and perineal pain: Secondary | ICD-10-CM

## 2016-06-13 DIAGNOSIS — R1030 Lower abdominal pain, unspecified: Secondary | ICD-10-CM | POA: Diagnosis present

## 2016-06-13 NOTE — ED Triage Notes (Signed)
Patient states she has a contriceptive ring. States she took her tampon out and felt something pull. States pain and cramping in lower abdomen with nausea when pain gets bad.

## 2016-06-13 NOTE — ED Provider Notes (Signed)
[facility] DEPT Provider Note   CSN: [id number] Arrival date & time: 06/13/16  2245  First Provider Contact:  First MD Initiated Contact with Patient 06/13/16 2332        History   Chief Complaint Chief Complaint  Patient presents with  . Abdominal Pain    HPI Kaylee Short is a 25 y.o. female.  HPI   Kaylee Short is a 25 y.o. female who presents to the Emergency Department complaining of lower abdominal cramping since 7:30 pm tonight.  She states that she has has an IUD and felt a "pulling sensation" when she removed her tampon.  She also reports having vaginal bleeding for 3 weeks, but has not been associated with pain.  She tried taking ibuprofen without relief.  She denies fever, vomiting, dysuria, and vaginal discharge.  She also states that her IUD has been in place for a long time and she wants to have it removed.    Past Medical History:  Diagnosis Date  . Bronchitis     There are no active problems to display for this patient.   Past Surgical History:  Procedure Laterality Date  . CESAREAN SECTION      OB History    Gravida Para Term Preterm AB Living   2         2   SAB TAB Ectopic Multiple Live Births                   Home Medications    Prior to Admission medications   Medication Sig Start Date End Date Taking? Authorizing Provider  albuterol (PROVENTIL HFA;VENTOLIN HFA) 108 (90 Base) MCG/ACT inhaler Inhale 1-2 puffs into the lungs every 6 (six) hours as needed for wheezing or shortness of breath. 02/19/16   Elson Areas, PA-C  buprenorphine-naloxone (SUBOXONE) 8-2 MG SUBL SL tablet Place 1 tablet under the tongue daily.    Historical Provider, MD  cyclobenzaprine (FLEXERIL) 5 MG tablet Take 1 tablet (5 mg total) by mouth 3 (three) times daily as needed (for muscle pain). 05/23/16   Devoria Albe, MD  HYDROcodone-acetaminophen (NORCO/VICODIN) 5-325 MG tablet Take 2 tablets by mouth every 4 (four) hours as needed. Patient not taking: Reported on  05/22/2016 05/14/16   Elson Areas, PA-C  ibuprofen (ADVIL,MOTRIN) 800 MG tablet Take 1 tablet (800 mg total) by mouth 3 (three) times daily. 05/14/16   Elson Areas, PA-C  levonorgestrel (MIRENA) 20 MCG/24HR IUD 1 each by Intrauterine route once.    Historical Provider, MD    Family History No family history on file.  Social History Social History  Substance Use Topics  . Smoking status: Current Every Day Smoker    Packs/day: 1.00    Types: Cigarettes  . Smokeless tobacco: Never Used  . Alcohol use No     Allergies   Review of patient's allergies indicates no known allergies.   Review of Systems Review of Systems  Constitutional: Negative for activity change, appetite change and fever.  Respiratory: Negative for chest tightness and shortness of breath.   Cardiovascular: Negative for chest pain.  Gastrointestinal: Negative for nausea and vomiting.  Genitourinary: Positive for pelvic pain and vaginal bleeding. Negative for decreased urine volume, dysuria, genital sores, hematuria and vaginal discharge.  Skin: Negative for rash.  Neurological: Negative for dizziness, syncope and weakness.     Physical Exam Updated Vital Signs BP 149/94 (BP Location: Right Arm)   Pulse 85   Temp 98.2 F (36.8 C) (Oral)  Resp 16   Ht 5\' 8"  (1.727 m)   Wt 83.9 kg   LMP 05/04/2016   SpO2 100%   BMI 28.13 kg/m   Physical Exam  Constitutional: She is oriented to person, place, and time. She appears well-developed and well-nourished. No distress.  HENT:  Head: Normocephalic.  Neck: Normal range of motion.  Cardiovascular: Normal rate and regular rhythm.   Pulmonary/Chest: Effort normal. No respiratory distress.  Abdominal: Soft. She exhibits no distension and no mass. There is no tenderness. There is no guarding.  Genitourinary: Uterus normal. There is no tenderness or lesion on the right labia. There is no tenderness or lesion on the left labia. Cervix exhibits no motion tenderness and  no friability. Right adnexum displays tenderness. Right adnexum displays no mass. Left adnexum displays no mass and no tenderness. There is bleeding in the vagina.  Genitourinary Comments: String from IUD is visualized and appears properly located. Mild right adnexal tenderness w/o mass  Musculoskeletal: Normal range of motion.  Neurological: She is alert and oriented to person, place, and time.  Skin: Skin is warm and dry.  Psychiatric: She has a normal mood and affect.  Nursing note and vitals reviewed.    ED Treatments / Results  Labs (all labs ordered are listed, but only abnormal results are displayed) Labs Reviewed  WET PREP, GENITAL - Abnormal; Notable for the following:       Result Value   Clue Cells Wet Prep HPF POC PRESENT (*)    WBC, Wet Prep HPF POC FEW (*)    All other components within normal limits  BASIC METABOLIC PANEL - Abnormal; Notable for the following:    Calcium 8.7 (*)    Anion gap 2 (*)    All other components within normal limits  CBC WITH DIFFERENTIAL/PLATELET - Abnormal; Notable for the following:    WBC 3.8 (*)    Platelets 138 (*)    All other components within normal limits  URINALYSIS, ROUTINE W REFLEX MICROSCOPIC (NOT AT [facility]) - Abnormal; Notable for the following:    Specific Gravity, Urine <1.005 (*)    All other components within normal limits  POC URINE PREG, ED  GC/CHLAMYDIA PROBE AMP () NOT AT [facility]    EKG  EKG Interpretation None       Radiology No results found.  Procedures Procedures (including critical care time)  Medications Ordered in ED Medications - No data to display   Initial Impression / Assessment and Plan / ED Course  I have reviewed the triage vital signs and the nursing notes.  Pertinent labs & imaging results that were available during my care of the patient were reviewed by me and considered in my medical decision making (see chart for details).  Clinical Course    Pt is well appearing.  Hx  of persistent vaginal bleeding for 3 weeks. crampy pelvic pain after removing a tampon.  IUD appears well positioned on exam, minimal blood in the vaginal vault.  Labs reassuring.  No concerning sx's for TOA, PID.  She has appt with women[facility] health in sept. for IUD removal, but would like referral info for [facility].     Pt reviewed on the [place] narcotic database, received #72 Suboxone 8/2 mg SL on 05/24/16  Final Clinical Impressions(s) / ED Diagnoses   Final diagnoses:  Pelvic pain in female  Abnormal vaginal bleeding    New Prescriptions New Prescriptions   No medications on file     Aziza Stuckert  Trisha Mangle, PA-C 06/14/16 1610    Devoria Albe, MD 06/14/16 [phone]

## 2016-06-14 LAB — BASIC METABOLIC PANEL
ANION GAP: 2 — AB (ref 5–15)
BUN: 10 mg/dL (ref 6–20)
CHLORIDE: 108 mmol/L (ref 101–111)
CO2: 25 mmol/L (ref 22–32)
Calcium: 8.7 mg/dL — ABNORMAL LOW (ref 8.9–10.3)
Creatinine, Ser: 0.73 mg/dL (ref 0.44–1.00)
GFR calc non Af Amer: >60 mL/min (ref >60)
GLUCOSE: 96 mg/dL (ref 65–99)
POTASSIUM: 3.7 mmol/L (ref 3.5–5.1)
Sodium: 135 mmol/L (ref 135–145)

## 2016-06-14 LAB — CBC WITH DIFFERENTIAL/PLATELET
Basophils Absolute: 0.1 10*3/uL (ref 0.0–0.1)
Basophils Relative: 2 %
Eosinophils Absolute: 0.2 10*3/uL (ref 0.0–0.7)
Eosinophils Relative: 5 %
HEMATOCRIT: 40.2 % (ref 36.0–46.0)
HEMOGLOBIN: 13.7 g/dL (ref 12.0–15.0)
LYMPHS ABS: 1.3 10*3/uL (ref 0.7–4.0)
LYMPHS PCT: 33 %
MCH: 29.8 pg (ref 26.0–34.0)
MCHC: 34.1 g/dL (ref 30.0–36.0)
MCV: 87.4 fL (ref 78.0–100.0)
MONO ABS: 0.3 10*3/uL (ref 0.1–1.0)
MONOS PCT: 8 %
NEUTROS ABS: 2 10*3/uL (ref 1.7–7.7)
NEUTROS PCT: 52 %
Platelets: 138 10*3/uL — ABNORMAL LOW (ref 150–400)
RBC: 4.6 MIL/uL (ref 3.87–5.11)
RDW: 12.3 % (ref 11.5–15.5)
WBC: 3.8 10*3/uL — ABNORMAL LOW (ref 4.0–10.5)

## 2016-06-14 LAB — URINALYSIS, ROUTINE W REFLEX MICROSCOPIC
BILIRUBIN URINE: NEGATIVE
GLUCOSE, UA: NEGATIVE mg/dL
Hgb urine dipstick: NEGATIVE
KETONES UR: NEGATIVE mg/dL
Leukocytes, UA: NEGATIVE
Nitrite: NEGATIVE
PH: 6 (ref 5.0–8.0)
Protein, ur: NEGATIVE mg/dL

## 2016-06-14 LAB — WET PREP, GENITAL
SPERM: NONE SEEN
TRICH WET PREP: NONE SEEN
YEAST WET PREP: NONE SEEN

## 2016-06-14 LAB — POC URINE PREG, ED: Preg Test, Ur: NEGATIVE

## 2016-06-14 MED ORDER — NAPROXEN 250 MG PO TABS
500.0000 mg | ORAL_TABLET | Freq: Once | ORAL | Status: AC
  Administered 2016-06-14: 500 mg via ORAL
  Filled 2016-06-14: qty 2

## 2016-06-14 NOTE — Discharge Instructions (Signed)
Call [facility] to arrange a follow-up appt

## 2016-06-15 LAB — GC/CHLAMYDIA PROBE AMP (~~LOC~~) NOT AT ARMC
Chlamydia: NEGATIVE
Neisseria Gonorrhea: NEGATIVE

## 2016-06-23 MED FILL — SUBOXONE 8 MG-2 MG SL FILM: 8-2 | 28 days supply | Qty: 70 | Fill #0

## 2016-07-22 MED FILL — SUBOXONE 8 MG-2 MG SL FILM: 8-2 | 7 days supply | Qty: 18 | Fill #0

## 2016-07-29 MED FILL — SUBOXONE 8 MG-2 MG SL FILM: 8-2 | 27 days supply | Qty: 70 | Fill #0

## 2016-08-26 MED FILL — SUBOXONE 8 MG-2 MG SL FILM: 8-2 | 28 days supply | Qty: 70 | Fill #0

## 2016-08-30 ENCOUNTER — Emergency Department (HOSPITAL_COMMUNITY)
Admission: EM | Admit: 2016-08-30 | Discharge: 2016-08-30 | Disposition: A | Discharge status: Home / Self Care | Payer: Medicaid Other | Attending: Emergency Medicine | Admitting: Emergency Medicine

## 2016-08-30 ENCOUNTER — Emergency Department (HOSPITAL_COMMUNITY): Payer: Medicaid Other

## 2016-08-30 ENCOUNTER — Encounter (HOSPITAL_COMMUNITY): Payer: Self-pay | Admitting: Emergency Medicine

## 2016-08-30 DIAGNOSIS — Z79899 Other long term (current) drug therapy: Secondary | ICD-10-CM | POA: Diagnosis not present

## 2016-08-30 DIAGNOSIS — M7711 Lateral epicondylitis, right elbow: Secondary | ICD-10-CM | POA: Insufficient documentation

## 2016-08-30 DIAGNOSIS — F17210 Nicotine dependence, cigarettes, uncomplicated: Secondary | ICD-10-CM | POA: Diagnosis not present

## 2016-08-30 DIAGNOSIS — M25521 Pain in right elbow: Secondary | ICD-10-CM | POA: Diagnosis present

## 2016-08-30 MED ORDER — NAPROXEN 500 MG PO TABS: 500.0000 mg | ORAL_TABLET | Freq: Two times a day (BID) | ORAL | 0 refills | Status: DC

## 2016-08-30 NOTE — Discharge Instructions (Signed)
Apply ice packs on/off to your elbow.  Do not wear the sling or the ACE wrap continuously.   Call Dr. Mort SawyersHArrison's office to arrange a follow-up appt in one week if not improving

## 2016-08-30 NOTE — ED Triage Notes (Signed)
Pt reports waking up with R elbow pain and stiffness. No known injury.

## 2016-09-02 NOTE — ED Provider Notes (Signed)
[facility] Provider Note   CSN: [id number] Arrival date & time: 08/30/16  1634     History   Chief Complaint Chief Complaint  Patient presents with  . Arm Pain    HPI Kaylee Short is a 25 y.o. female.  HPI  Kaylee Short is a 25 y.o. female who presents to the Emergency Department complaining of right elbow pain and "stiffness" for several days.  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  Pain to the elbow is worse with movement and extension.  She has tried ibuprofen w/o relief.  She denies numbness, shoulder or wrist pain, neck pain.   Past Medical History:  Diagnosis Date  . Bronchitis     There are no active problems to display for this patient.   Past Surgical History:  Procedure Laterality Date  . CESAREAN SECTION      OB History    Gravida Para Term Preterm AB Living   2         2   SAB TAB Ectopic Multiple Live Births                   Home Medications    Prior to Admission medications   Medication Sig Start Date End Date Taking? Authorizing Provider  albuterol (PROVENTIL HFA;VENTOLIN HFA) 108 (90 Base) MCG/ACT inhaler Inhale 1-2 puffs into the lungs every 6 (six) hours as needed for wheezing or shortness of breath. 02/19/16   Elson Areas, PA-C  buprenorphine-naloxone (SUBOXONE) 8-2 MG SUBL SL tablet Place 1 tablet under the tongue daily.    Historical Provider, MD  cyclobenzaprine (FLEXERIL) 5 MG tablet Take 1 tablet (5 mg total) by mouth 3 (three) times daily as needed (for muscle pain). 05/23/16   Devoria Albe, MD  HYDROcodone-acetaminophen (NORCO/VICODIN) 5-325 MG tablet Take 2 tablets by mouth every 4 (four) hours as needed. Patient not taking: Reported on 05/22/2016 05/14/16   Elson Areas, PA-C  levonorgestrel [employer] [employer]) 20 MCG/24HR IUD 1 each by Intrauterine route once.    Historical Provider, MD  naproxen (NAPROSYN) 500 MG tablet Take 1 tablet (500 mg total) by mouth 2 (two) times daily with a meal.  08/30/16   Sender Rueb, PA-C    Family History History reviewed. No pertinent family history.  Social History Social History  Substance Use Topics  . Smoking status: Current Every Day Smoker    Packs/day: 1.00    Types: Cigarettes  . Smokeless tobacco: Never Used  . Alcohol use No     Allergies   Review of patient's allergies indicates no known allergies.   Review of Systems Review of Systems  Constitutional: Negative for chills and fever.  Genitourinary: Negative for difficulty urinating and dysuria.  Musculoskeletal: Positive for arthralgias (right elbow pain) and joint swelling. Negative for neck pain.  Skin: Negative for color change and wound.  Neurological: Negative for weakness, numbness and headaches.  All other systems reviewed and are negative.    Physical Exam Updated Vital Signs BP 144/87 (BP Location: Left Arm)   Pulse 80   Temp 99 F (37.2 C) (Oral)   Resp 18   Ht 5\' 7"  (1.702 m)   Wt 78.5 kg   LMP 08/16/2016   SpO2 100%   BMI 27.10 kg/m   Physical Exam  Constitutional: She is oriented to person, place, and time. She appears well-developed and well-nourished. No distress.  HENT:  Head: Normocephalic and atraumatic.  Cardiovascular: Normal rate and  regular rhythm.   Pulmonary/Chest: Effort normal and breath sounds normal.  Musculoskeletal: She exhibits edema and tenderness. She exhibits no deformity.  Focal tenderness of the lateral right elbow.  Mild edema. Radial pulse is brisk, distal sensation intact.  CR< 2 sec.  No excessive warmth or erythema or bony deformity.  Patient has full ROM. Compartments soft.  Neurological: She is alert and oriented to person, place, and time. She exhibits normal muscle tone. Coordination normal.  Skin: Skin is warm and dry.  Nursing note and vitals reviewed.    ED Treatments / Results  Labs (all labs ordered are listed, but only abnormal results are displayed) Labs Reviewed - No data to display  EKG   EKG Interpretation None       Radiology Dg Elbow Complete Right  Result Date: 08/30/2016 CLINICAL DATA:  25 year old female with right elbow pain and stiffness. No known injury. EXAM: RIGHT ELBOW - COMPLETE 3+ VIEW COMPARISON:  None. FINDINGS: There is no evidence of fracture, dislocation, or joint effusion. There is no evidence of arthropathy or other focal bone abnormality. Soft tissues are unremarkable. IMPRESSION: Negative. Electronically Signed   By: Malachy MoanHeath  McCullough M.D.   On: 08/30/2016 17:11     Procedures Procedures (including critical care time)  Medications Ordered in ED Medications - No data to display   Initial Impression / Assessment and Plan / ED Course  I have reviewed the triage vital signs and the nursing notes.  Pertinent labs & imaging results that were available during my care of the patient were reviewed by me and considered in my medical decision making (see chart for details).  Clinical Course   Pt with focal tenderness of the lateral right elbow.  No concerning sx's for septic joint.  Pain likely related to repetitive use.  Remains NV intact.  Sling applied for comfort.  Agrees to ice and NSAID, ortho f/u if not improving  Final Clinical Impressions(s) / ED Diagnoses   Final diagnoses:  Lateral epicondylitis of right elbow    New Prescriptions Discharge Medication List as of 08/30/2016  6:11 PM    START taking these medications   Details  naproxen (NAPROSYN) 500 MG tablet Take 1 tablet (500 mg total) by mouth 2 (two) times daily with a meal., Starting Tue 08/30/2016, Print         Angelic Schnelle [place], PA-C 09/02/16 1312    Donnetta HutchingBrian Cook, MD 09/06/16 1333

## 2016-09-06 ENCOUNTER — Encounter (HOSPITAL_COMMUNITY): Payer: Self-pay | Admitting: Emergency Medicine

## 2016-09-06 ENCOUNTER — Emergency Department (HOSPITAL_COMMUNITY)
Admission: EM | Admit: 2016-09-06 | Discharge: 2016-09-06 | Disposition: A | Discharge status: Home / Self Care | Payer: Medicaid Other | Attending: Emergency Medicine | Admitting: Emergency Medicine

## 2016-09-06 DIAGNOSIS — Z79899 Other long term (current) drug therapy: Secondary | ICD-10-CM | POA: Insufficient documentation

## 2016-09-06 DIAGNOSIS — M25521 Pain in right elbow: Secondary | ICD-10-CM | POA: Insufficient documentation

## 2016-09-06 DIAGNOSIS — Z791 Long term (current) use of non-steroidal anti-inflammatories (NSAID): Secondary | ICD-10-CM | POA: Diagnosis not present

## 2016-09-06 DIAGNOSIS — F17210 Nicotine dependence, cigarettes, uncomplicated: Secondary | ICD-10-CM | POA: Insufficient documentation

## 2016-09-06 NOTE — Discharge Instructions (Signed)
Return if any problems.

## 2016-09-06 NOTE — ED Provider Notes (Signed)
[facility] DEPT Provider Note   CSN: [id number] Arrival date & time: 09/06/16  1557     History   Chief Complaint Chief Complaint  Patient presents with  . Elbow Injury    HPI Kaylee Short is a 25 y.o. female.  The history is provided by the patient. No language interpreter was used.  Pt reports she was treated here on 10/ 24 for elbow tendonitis.  Pt reports she has to have a note to return to work.  Pt reports no current pain.  Elbow feels better   Past Medical History:  Diagnosis Date  . Bronchitis     There are no active problems to display for this patient.   Past Surgical History:  Procedure Laterality Date  . CESAREAN SECTION      OB History    Gravida Para Term Preterm AB Living   2         2   SAB TAB Ectopic Multiple Live Births                   Home Medications    Prior to Admission medications   Medication Sig Start Date End Date Taking? Authorizing Provider  albuterol (PROVENTIL HFA;VENTOLIN HFA) 108 (90 Base) MCG/ACT inhaler Inhale 1-2 puffs into the lungs every 6 (six) hours as needed for wheezing or shortness of breath. 02/19/16   Elson Areas, PA-C  buprenorphine-naloxone (SUBOXONE) 8-2 MG SUBL SL tablet Place 1 tablet under the tongue daily.    Historical Provider, MD  cyclobenzaprine (FLEXERIL) 5 MG tablet Take 1 tablet (5 mg total) by mouth 3 (three) times daily as needed (for muscle pain). 05/23/16   Devoria Albe, MD  HYDROcodone-acetaminophen (NORCO/VICODIN) 5-325 MG tablet Take 2 tablets by mouth every 4 (four) hours as needed. Patient not taking: Reported on 05/22/2016 05/14/16   Elson Areas, PA-C  levonorgestrel [facility]) 20 MCG/24HR IUD 1 each by Intrauterine route once.    Historical Provider, MD  naproxen (NAPROSYN) 500 MG tablet Take 1 tablet (500 mg total) by mouth 2 (two) times daily with a meal. 08/30/16   Tammy Triplett, PA-C    Family History History reviewed. No pertinent family history.  Social History Social History    Substance Use Topics  . Smoking status: Current Every Day Smoker    Packs/day: 1.00    Types: Cigarettes  . Smokeless tobacco: Never Used  . Alcohol use No     Allergies   Review of patient's allergies indicates no known allergies.   Review of Systems Review of Systems  All other systems reviewed and are negative.    Physical Exam Updated Vital Signs BP 147/99 (BP Location: Left Arm)   Pulse 85   Temp 98.6 F (37 C) (Oral)   Resp 14   Ht 5\' 7"  (1.702 m)   Wt 78.5 kg   LMP 08/16/2016   SpO2 99%   BMI 27.10 kg/m   Physical Exam  Constitutional: She is oriented to person, place, and time. She appears well-developed and well-nourished.  Musculoskeletal: She exhibits tenderness. She exhibits no deformity.  Neurological: She is alert and oriented to person, place, and time. She has normal reflexes.  Nursing note and vitals reviewed.    ED Treatments / Results  Labs (all labs ordered are listed, but only abnormal results are displayed) Labs Reviewed - No data to display  EKG  EKG Interpretation None       Radiology No results found.  Procedures Procedures (including  critical care time)  Medications Ordered in ED Medications - No data to display   Initial Impression / Assessment and Plan / ED Course  I have reviewed the triage vital signs and the nursing notes.  Pertinent labs & imaging results that were available during my care of the patient were reviewed by me and considered in my medical decision making (see chart for details).  Clinical Course   Pt given a note to return to work   Final Clinical Impressions(s) / ED Diagnoses   Final diagnoses:  Right elbow pain    New Prescriptions Discharge Medication List as of 09/06/2016  5:35 PM       Elson AreasLeslie K Sofia, PA-C 09/06/16 1943    Canary Brimhristopher J Tegeler, MD 09/07/16 1339

## 2016-09-06 NOTE — ED Triage Notes (Signed)
Pt seen here for elbow pain, was taken out of work. Pt needs note to return to work.

## 2016-09-23 MED FILL — SUBOXONE 8 MG-2 MG SL FILM: 8-2 | 28 days supply | Qty: 70 | Fill #0

## 2016-09-25 ENCOUNTER — Emergency Department (HOSPITAL_COMMUNITY)
Admission: EM | Admit: 2016-09-25 | Discharge: 2016-09-25 | Disposition: A | Discharge status: Home / Self Care | Payer: Medicaid Other | Attending: Emergency Medicine | Admitting: Emergency Medicine

## 2016-09-25 ENCOUNTER — Encounter (HOSPITAL_COMMUNITY): Payer: Self-pay | Admitting: *Deleted

## 2016-09-25 DIAGNOSIS — Z791 Long term (current) use of non-steroidal anti-inflammatories (NSAID): Secondary | ICD-10-CM | POA: Diagnosis not present

## 2016-09-25 DIAGNOSIS — F17210 Nicotine dependence, cigarettes, uncomplicated: Secondary | ICD-10-CM | POA: Insufficient documentation

## 2016-09-25 DIAGNOSIS — Z79899 Other long term (current) drug therapy: Secondary | ICD-10-CM | POA: Diagnosis not present

## 2016-09-25 DIAGNOSIS — J029 Acute pharyngitis, unspecified: Secondary | ICD-10-CM | POA: Diagnosis not present

## 2016-09-25 LAB — RAPID STREP SCREEN (MED CTR MEBANE ONLY): STREPTOCOCCUS, GROUP A SCREEN (DIRECT): NEGATIVE

## 2016-09-25 MED ORDER — AMOXICILLIN 500 MG PO CAPS: 500.0000 mg | ORAL_CAPSULE | Freq: Three times a day (TID) | ORAL | 0 refills | Status: DC

## 2016-09-25 NOTE — ED Provider Notes (Signed)
[facility] Provider Note   CSN: [id number] Arrival date & time: 09/25/16  1116  By signing my name below, I, Kaylee Short, attest that this documentation has been prepared under the direction and in the presence of Kaylee EdwardsLeslie Karen Maylin [place], PA-C.  Electronically Signed: Rosario AdieWilliam Andrew Short, ED Scribe. 09/25/16. 12:08 PM.  History   Chief Complaint Chief Complaint  Patient presents with  . Sore Throat   The history is provided by the patient. No language interpreter was used.    HPI Comments: Kaylee Short is a [age] y.o. female with a PMHx of bronchitis, who presents to the Emergency Department complaining of gradual onset, constant sore throat onset approximately 3 days ago. She reports associated non-productive cough and left ear pain secondary to her sore throat. No noted treatments were tried prior to coming into the ED. Pt is able to tolerate their own secretions well, but pain is exacerbated w/ swallowing. Her daughter is currently sick with similar symtpoms. She denies fever, or any other associated symptoms.  Past Medical History:  Diagnosis Date  . Bronchitis    There are no active problems to display for this patient.  Past Surgical History:  Procedure Laterality Date  . CESAREAN SECTION     OB History    Gravida Para Term Preterm AB Living   2         2   SAB TAB Ectopic Multiple Live Births                 Home Medications    Prior to Admission medications   Medication Sig Start Date End Date Taking? Authorizing Provider  albuterol (PROVENTIL HFA;VENTOLIN HFA) 108 (90 Base) MCG/ACT inhaler Inhale 1-2 puffs into the lungs every 6 (six) hours as needed for wheezing or shortness of breath. 02/19/16   Elson AreasLeslie K Janah Mcculloh, PA-C  amoxicillin (AMOXIL) 500 MG capsule Take 1 capsule (500 mg total) by mouth 3 (three) times daily. 09/25/16   Elson AreasLeslie K Yasamin Karel, PA-C  buprenorphine-naloxone (SUBOXONE) 8-2 MG SUBL SL tablet Place 1 tablet under the tongue daily.     Historical Provider, MD  cyclobenzaprine (FLEXERIL) 5 MG tablet Take 1 tablet (5 mg total) by mouth 3 (three) times daily as needed (for muscle pain). 05/23/16   Devoria AlbeIva Knapp, MD  HYDROcodone-acetaminophen (NORCO/VICODIN) 5-325 MG tablet Take 2 tablets by mouth every 4 (four) hours as needed. Patient not taking: Reported on 05/22/2016 05/14/16   Elson AreasLeslie K Rebecah Dangerfield, PA-C  levonorgestrel [facility](MIRENA) 20 MCG/24HR IUD 1 each by Intrauterine route once.    Historical Provider, MD  naproxen (NAPROSYN) 500 MG tablet Take 1 tablet (500 mg total) by mouth 2 (two) times daily with a meal. 08/30/16   Tammy Triplett, PA-C   Family History No family history on file.  Social History Social History  Substance Use Topics  . Smoking status: Current Every Day Smoker    Packs/day: 0.50    Types: Cigarettes  . Smokeless tobacco: Never Used  . Alcohol use No   Allergies   Patient has no known allergies.  Review of Systems Review of Systems  Constitutional: Negative for fever.  HENT: Positive for sore throat.   Respiratory: Positive for cough.   All other systems reviewed and are negative.  Physical Exam Updated Vital Signs BP 130/89 (BP Location: Left Arm)   Pulse 81   Temp 97.9 F (36.6 C) (Oral)   Resp 18   Ht 5' 7.5" (1.715 m)   Wt 169 lb (76.7 kg)  LMP 09/18/2016   SpO2 100%   BMI 26.08 kg/m   Physical Exam  Constitutional: She appears well-developed and well-nourished. No distress.  HENT:  Head: Normocephalic and atraumatic.  Right Ear: External ear normal.  Left Ear: External ear normal.  Nose: Nose normal.  Mouth/Throat: Uvula is midline. Posterior oropharyngeal erythema present. No oropharyngeal exudate or tonsillar abscesses.  Erythematous, injected midthroat w/o exudates.   Eyes: Conjunctivae are normal.  Neck: Normal range of motion.  Cardiovascular: Normal rate.   Pulmonary/Chest: Effort normal.  Abdominal: She exhibits no distension.  Musculoskeletal: Normal range of motion.    Neurological: She is alert.  Skin: No pallor.  Psychiatric: She has a normal mood and affect. Her behavior is normal.  Nursing note and vitals reviewed.  ED Treatments / Results  DIAGNOSTIC STUDIES: Oxygen Saturation is 100% on RA, normal my interpretation.   COORDINATION OF CARE: 11:53 AM-Discussed next steps with pt. Pt verbalized understanding and is agreeable with the plan.   Labs (all labs ordered are listed, but only abnormal results are displayed) Labs Reviewed  RAPID STREP SCREEN (NOT AT [facility])  CULTURE, GROUP A STREP [facility])   Procedures Procedures   Medications Ordered in ED Medications - No data to display  Initial Impression / Assessment and Plan / ED Course  I have reviewed the triage vital signs and the nursing notes.   Pertinent labs results that were available during my care of the patient were reviewed by me and considered in my medical decision making (see chart for details).  Clinical Course    Pt is a [age]yo female who presents into the ED with a 3-day h/o sore throat. Afebrile in the ED, no exudates present. Exam reveals injection/erythema along the mid-throat. Will screen for strep. Pt appears mildly dehydrated, discussed importance of water rehydration. Presentation non-concerning for PTA or infxn spread to soft tissue. No trismus or uvula deviation. Specific return precautions discussed. Pt able to drink water in ED without difficulty with intact air way.   12:38 PM  Strep result is negative, will send for culture. Will d/c home w/ Amoxicillin. Likely viral etiology. Pt is comfortable with above plan and is stable for discharge at this time. All questions were answered prior to disposition. Strict return precautions for return into the ED were discussed  Final Clinical Impressions(s) / ED Diagnoses   Final diagnoses:  Pharyngitis, unspecified etiology   New Prescriptions New Prescriptions   AMOXICILLIN (AMOXIL) 500 MG CAPSULE    Take 1 capsule (500  mg total) by mouth 3 (three) times daily.   An After Visit Summary was printed and given to the patient. I personally performed the services in this documentation, which was scribed in my presence.  The recorded information has been reviewed and considered.   Barnet PallKaren SofiaPAC.   Lonia SkinnerLeslie K CroomSofia, PA-C 09/25/16 1256    Azalia BilisKevin Campos, MD 09/25/16 [phone]

## 2016-09-25 NOTE — ED Triage Notes (Signed)
Pt reports sore throat that started 3 days ago. Denies fever. Non-productive cough.

## 2016-09-28 LAB — CULTURE, GROUP A STREP (THRC)

## 2016-10-14 MED FILL — PROMETHAZINE 25 MG TABLET: 25 | 10 days supply | Qty: 30 | Fill #0

## 2016-10-14 MED FILL — cloNIDine HCL 0.1 MG TABS: 0.1 | 7 days supply | Qty: 14 | Fill #0

## 2016-11-16 ENCOUNTER — Emergency Department (HOSPITAL_COMMUNITY)
Admission: EM | Admit: 2016-11-16 | Discharge: 2016-11-17 | Disposition: A | Discharge status: Home / Self Care | Payer: Medicaid Other | Attending: Emergency Medicine | Admitting: Emergency Medicine

## 2016-11-16 ENCOUNTER — Encounter (HOSPITAL_COMMUNITY): Payer: Self-pay | Admitting: Emergency Medicine

## 2016-11-16 DIAGNOSIS — Z3A01 Less than 8 weeks gestation of pregnancy: Secondary | ICD-10-CM | POA: Insufficient documentation

## 2016-11-16 DIAGNOSIS — F17210 Nicotine dependence, cigarettes, uncomplicated: Secondary | ICD-10-CM | POA: Insufficient documentation

## 2016-11-16 DIAGNOSIS — O219 Vomiting of pregnancy, unspecified: Secondary | ICD-10-CM | POA: Diagnosis not present

## 2016-11-16 DIAGNOSIS — Z3490 Encounter for supervision of normal pregnancy, unspecified, unspecified trimester: Secondary | ICD-10-CM

## 2016-11-16 DIAGNOSIS — R109 Unspecified abdominal pain: Secondary | ICD-10-CM

## 2016-11-16 DIAGNOSIS — R509 Fever, unspecified: Secondary | ICD-10-CM | POA: Insufficient documentation

## 2016-11-16 DIAGNOSIS — Z79899 Other long term (current) drug therapy: Secondary | ICD-10-CM | POA: Insufficient documentation

## 2016-11-16 DIAGNOSIS — O26891 Other specified pregnancy related conditions, first trimester: Secondary | ICD-10-CM | POA: Diagnosis present

## 2016-11-16 DIAGNOSIS — R112 Nausea with vomiting, unspecified: Secondary | ICD-10-CM

## 2016-11-16 DIAGNOSIS — R102 Pelvic and perineal pain: Secondary | ICD-10-CM | POA: Diagnosis not present

## 2016-11-16 DIAGNOSIS — O99331 Smoking (tobacco) complicating pregnancy, first trimester: Secondary | ICD-10-CM | POA: Diagnosis not present

## 2016-11-16 LAB — CBC
HCT: 40 % (ref 36.0–46.0)
HEMOGLOBIN: 14.1 g/dL (ref 12.0–15.0)
MCH: 31 pg (ref 26.0–34.0)
MCHC: 35.3 g/dL (ref 30.0–36.0)
MCV: 87.9 fL (ref 78.0–100.0)
Platelets: 141 10*3/uL — ABNORMAL LOW (ref 150–400)
RBC: 4.55 MIL/uL (ref 3.87–5.11)
RDW: 12.3 % (ref 11.5–15.5)
WBC: 4.6 10*3/uL (ref 4.0–10.5)

## 2016-11-16 LAB — URINALYSIS, ROUTINE W REFLEX MICROSCOPIC
BILIRUBIN URINE: NEGATIVE
Glucose, UA: NEGATIVE mg/dL
HGB URINE DIPSTICK: NEGATIVE
Ketones, ur: 80 mg/dL — AB
LEUKOCYTES UA: NEGATIVE
NITRITE: NEGATIVE
PROTEIN: 30 mg/dL — AB
Specific Gravity, Urine: 1.024 (ref 1.005–1.030)
pH: 6 (ref 5.0–8.0)

## 2016-11-16 MED ORDER — METOCLOPRAMIDE HCL 5 MG/ML IJ SOLN
10.0000 mg | Freq: Once | INTRAMUSCULAR | Status: AC
  Administered 2016-11-16: 10 mg via INTRAVENOUS
  Filled 2016-11-16: qty 2

## 2016-11-16 MED ORDER — SODIUM CHLORIDE 0.9 % IV BOLUS (SEPSIS)
1000.0000 mL | Freq: Once | INTRAVENOUS | Status: AC
  Administered 2016-11-16: 1000 mL via INTRAVENOUS

## 2016-11-16 NOTE — ED Provider Notes (Signed)
[facility] DE[facility] Provider Note   CSN: [id number] Arrival date & time: 11/16/16  2106    By signing my name below, I, Valentino Saxon, attest that this documentation has been prepared under the direction and in the presence of Eber Hong, MD. Electronically Signed: Valentino Saxon, ED Scribe. 11/16/16. 11:26 PM.  History   Chief Complaint Chief Complaint  Patient presents with  . Emesis   The history is provided by the patient. No language interpreter was used.    HPI Comments: Kaylee Short is a 26 y.o. female with no pertinent PMHx who presents to the Emergency Department complaining of moderate, intermittent nausea with episodic emesis onset four days ago. Pt notes she found out she was pregnant on 12/25, but states she has not seen an OB-GYN but has appointment on 1/15. She reports associated appetite change accompanied by an intermittent, subjective fever. Pt's temperature in the ED today was 98.1. She reports having a mild discomfort in her suprapubic area. Pt notes her nausea is relieved when laying down, but notes it is exacerbated with movement. She also reports having some mild back pain that began two days ago. No alleviating factors noted. Pt reports being on suboxone for ~a year. She states her LMP was in the beginning of November 2017. Denies HA, blurred vision, CP, SOB, dysuria, frequency, rash, vaginal bleeding, vaginal discharge. She also denies starting any new medications. Denies hx of tubal pregnancy. Pt notes having an abortion at the age of 49. She states having two living children which she delivered through cesarean section but no history of tubal pregnancy.   Past Medical History:  Diagnosis Date  . Bronchitis     There are no active problems to display for this patient.   Past Surgical History:  Procedure Laterality Date  . CESAREAN SECTION      OB History    Gravida Para Term Preterm AB Living   3         2   SAB TAB Ectopic Multiple Live Births                    Home Medications    Prior to Admission medications   Medication Sig Start Date End Date Taking? Authorizing Provider  acetaminophen (TYLENOL) 325 MG tablet Take 325-650 mg by mouth every 6 (six) hours as needed.   Yes Historical Provider, MD  albuterol (PROVENTIL HFA;VENTOLIN HFA) 108 (90 Base) MCG/ACT inhaler Inhale 1-2 puffs into the lungs every 6 (six) hours as needed for wheezing or shortness of breath. 02/19/16  Yes Lonia Skinner Sofia, PA-C  buprenorphine-naloxone (SUBOXONE) 8-2 MG SUBL SL tablet Place 1 tablet under the tongue daily.    Historical Provider, MD  metoCLOPramide (REGLAN) 10 MG tablet Take 1 tablet (10 mg total) by mouth every 6 (six) hours. 11/17/16   Eber Hong, MD    Family History No family history on file.  Social History Social History  Substance Use Topics  . Smoking status: Current Every Day Smoker    Packs/day: 0.50    Types: Cigarettes  . Smokeless tobacco: Never Used  . Alcohol use No     Allergies   Patient has no known allergies.   Review of Systems Review of Systems  Constitutional: Positive for appetite change and fever.  Eyes: Negative for visual disturbance.  Respiratory: Negative for shortness of breath.   Cardiovascular: Negative for chest pain and leg swelling.  Gastrointestinal: Positive for nausea and vomiting.  Genitourinary: Negative for dysuria,  frequency, vaginal bleeding and vaginal discharge.  Musculoskeletal: Positive for back pain.  Skin: Negative for rash.  Neurological: Negative for headaches.  All other systems reviewed and are negative.    Physical Exam Updated Vital Signs BP 132/74 (BP Location: Right Arm)   Pulse 74   Temp 98.1 F (36.7 C) (Oral)   Resp 18   Ht 5\' 7"  (1.702 m)   Wt 169 lb (76.7 kg)   LMP 09/17/2016   SpO2 100%   BMI 26.47 kg/m   Physical Exam  Constitutional: She appears well-developed and well-nourished.  HENT:  Head: Normocephalic and atraumatic.  No lymphopathy of  chest.   Eyes: Conjunctivae are normal. Right eye exhibits no discharge. Left eye exhibits no discharge.  Pulmonary/Chest: Effort normal. No respiratory distress.  Abdominal: Soft. There is no tenderness.  She describes mild discomfort bilateral pelvic area, but denies tenderness to palpation.   Musculoskeletal: She exhibits no tenderness.  No CVA tenderness.   Neurological: She is alert. Coordination normal.  Skin: Skin is warm and dry. No rash noted. She is not diaphoretic. No erythema.  No jaundice.   Psychiatric: She has a normal mood and affect.  Nursing note and vitals reviewed.    ED Treatments / Results   DIAGNOSTIC STUDIES: Oxygen Saturation is 100% on RA, normal by my interpretation.    COORDINATION OF CARE: 11:10 PM Discussed treatment plan with pt at bedside which includes labs, nausea medication and pt agreed to plan. Pt was offered pelvic exam, but she declined.    Labs (all labs ordered are listed, but only abnormal results are displayed) Labs Reviewed  CBC - Abnormal; Notable for the following:       Result Value   Platelets 141 (*)    All other components within normal limits  COMPREHENSIVE METABOLIC PANEL - Abnormal; Notable for the following:    Sodium 133 (*)    Potassium 3.4 (*)    AST 14 (*)    ALT 13 (*)    All other components within normal limits  URINALYSIS, ROUTINE W REFLEX MICROSCOPIC - Abnormal; Notable for the following:    APPearance HAZY (*)    Ketones, ur 80 (*)    Protein, ur 30 (*)    Bacteria, UA RARE (*)    All other components within normal limits  HCG, QUANTITATIVE, PREGNANCY - Abnormal; Notable for the following:    hCG, Beta Chain, Quant, S 34,269 (*)    All other components within normal limits    Radiology No results found.  Procedures Procedures (including critical care time)  Medications Ordered in ED Medications  metoCLOPramide (REGLAN) injection 10 mg (10 mg Intravenous Given 11/16/16 2344)  sodium chloride 0.9 %  bolus 1,000 mL (0 mLs Intravenous Stopped 11/17/16 0019)  sodium chloride 0.9 % bolus 1,000 mL (1,000 mLs Intravenous Bolus from Bag 11/16/16 2330)     Initial Impression / Assessment and Plan / ED Course  I have reviewed the triage vital signs and the nursing notes.  Pertinent labs & imaging results that were available during my care of the patient were reviewed by me and considered in my medical decision making (see chart for details).  Clinical Course    Vitals:   11/16/16 2107 11/17/16 0019  BP: 140/97 132/74  Pulse: 103 74  Resp: 20 18  Temp: 98.1 F (36.7 C)   TempSrc: Oral   SpO2: 100% 100%  Weight: 169 lb (76.7 kg)   Height: 5\' 7"  (1.702 m)  Though the pt states that she has had fever, she does not have one here.  She has clear lungs and is not tachycardic on my exam.  Her abdomen is absolutely diffusely soft without any tenderness or guarding.  There is no signs of infection anywhere else.  I have discussed with the pt the need for further testing - there is need to r/o UTI, she declines a pelvic exam when offered.  She had no CVA ttp to suggest Pyelo but UA ordered to r/o given lower abd symptoms.  She is in agreement with the plan.  Quant is high, no vaginal bleeding or sig pain - can come back in AM for US - this has been ordered, the reglan worked great and hydration as well - she has no nausea on d/c.  She is agreeable to return for s/s of severe pain, fainting or worsneing vaginal bleeding /vomiting.  MDM  Final Clinical Impressions(s) / ED Diagnoses   Final diagnoses:  Non-intractable vomiting with nausea, unspecified vomiting type    New Prescriptions New Prescriptions   METOCLOPRAMIDE (REGLAN) 10 MG TABLET    Take 1 tablet (10 mg total) by mouth every 6 (six) hours.    I personally performed the services described in this documentation, which was scribed in my presence. The recorded information has been reviewed and is accurate.        Eber HongBrian Cameo Schmiesing,  MD 11/17/16 [phone]

## 2016-11-16 NOTE — ED Triage Notes (Signed)
Pt c/o vomiting and fever. Pt states she found out she was pregnant on christmas, but has not seen obgyn yet.

## 2016-11-17 ENCOUNTER — Ambulatory Visit (HOSPITAL_COMMUNITY)
Admission: RE | Admit: 2016-11-17 | Discharge: 2016-11-17 | Disposition: A | Discharge status: Home / Self Care | Payer: Medicaid Other | Source: Ambulatory Visit | Attending: Emergency Medicine | Admitting: Emergency Medicine

## 2016-11-17 ENCOUNTER — Other Ambulatory Visit (HOSPITAL_COMMUNITY): Payer: Self-pay | Admitting: Emergency Medicine

## 2016-11-17 DIAGNOSIS — R109 Unspecified abdominal pain: Secondary | ICD-10-CM | POA: Diagnosis present

## 2016-11-17 DIAGNOSIS — Z3A01 Less than 8 weeks gestation of pregnancy: Secondary | ICD-10-CM | POA: Insufficient documentation

## 2016-11-17 DIAGNOSIS — Z3490 Encounter for supervision of normal pregnancy, unspecified, unspecified trimester: Secondary | ICD-10-CM

## 2016-11-17 LAB — COMPREHENSIVE METABOLIC PANEL
ALBUMIN: 4.3 g/dL (ref 3.5–5.0)
ALK PHOS: 42 U/L (ref 38–126)
ALT: 13 U/L — AB (ref 14–54)
AST: 14 U/L — AB (ref 15–41)
Anion gap: 8 (ref 5–15)
BUN: 7 mg/dL (ref 6–20)
CHLORIDE: 102 mmol/L (ref 101–111)
CO2: 23 mmol/L (ref 22–32)
Calcium: 8.9 mg/dL (ref 8.9–10.3)
Creatinine, Ser: 0.65 mg/dL (ref 0.44–1.00)
GFR calc Af Amer: >60 mL/min (ref >60)
GFR calc non Af Amer: >60 mL/min (ref >60)
Glucose, Bld: 89 mg/dL (ref 65–99)
Potassium: 3.4 mmol/L — ABNORMAL LOW (ref 3.5–5.1)
SODIUM: 133 mmol/L — AB (ref 135–145)
TOTAL PROTEIN: 7.2 g/dL (ref 6.5–8.1)
Total Bilirubin: 1.2 mg/dL (ref 0.3–1.2)

## 2016-11-17 LAB — HCG, QUANTITATIVE, PREGNANCY: HCG, BETA CHAIN, QUANT, S: 34269 m[IU]/mL — AB (ref <5)

## 2016-11-17 MED ORDER — METOCLOPRAMIDE HCL 10 MG PO TABS: 10.0000 mg | ORAL_TABLET | Freq: Four times a day (QID) | ORAL | 0 refills | Status: DC

## 2016-11-17 NOTE — ED Provider Notes (Signed)
Pt returned to the ED for US as ordered by Dr. Hyacinth MeekerMiller.  US revealed [redacted] week pregnant IUP with small subchorionic hemorrhage.  Pt looks good and has PNV.  She has an ob appt on 1/15.  She knows to return if worse.   Jacalyn LefevreJulie Alyric Parkin, MD 11/17/16 1155

## 2016-11-17 NOTE — Discharge Instructions (Signed)
Drink plenty of water Reglan every 8 hours as needed for nausea

## 2016-11-22 MED FILL — SUBOXONE 8 MG-2 MG SL FILM: 8-2 | 28 days supply | Qty: 70 | Fill #0

## 2017-01-17 MED FILL — SUBOXONE 8 MG-2 MG SL FILM: 8-2 | 28 days supply | Qty: 70 | Fill #0

## 2017-01-18 ENCOUNTER — Ambulatory Visit (INDEPENDENT_AMBULATORY_CARE_PROVIDER_SITE_OTHER): Payer: Medicaid Other | Admitting: Orthopaedic Surgery

## 2017-01-18 ENCOUNTER — Encounter (INDEPENDENT_AMBULATORY_CARE_PROVIDER_SITE_OTHER): Payer: Self-pay | Admitting: Orthopaedic Surgery

## 2017-01-18 DIAGNOSIS — M79641 Pain in right hand: Secondary | ICD-10-CM | POA: Diagnosis not present

## 2017-01-18 NOTE — Progress Notes (Signed)
   Office Visit Note   Patient: Kaylee Short           Date of Birth: 03/07/1991           MRN: [id number] Visit Date: 01/18/2017              Requested by: No referring provider defined for this encounter. PCP: No PCP Per Patient   Assessment & Plan: Visit Diagnoses: comminuted tuft fracture right ring finger with nail avulsion Plan:clean nail bed, splint,office one week, continue Keflex per urgent care instruction  Follow-Up Instructions: No Follow-up on file.   Orders:  No orders of the defined types were placed in this encounter.  No orders of the defined types were placed in this encounter.     Procedures: No procedures performed   Clinical Data: No additional findings. I reviewed the finger films on a disc provided by the patient. There is a comminuted fracture at  the very tip of the distal phalanx. There is no displacement.  Subjective: Chief Complaint  Patient presents with  . Right Ring Finger - Injury    Pt shut her Right ring finger in wooden door, went to urgent care for xrays, splinted there, fx  X-rays of urgent care demonstrated a comminuted fracture of the distal tuft of the right fourth finger. The finger was splinted and is here today for follow-up evaluation. 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  Review of Systems   Objective: Vital Signs: LMP 09/18/2016   Physical Exam  Ortho Exam dressing was removed from the right ring finger. there is been a complete avulsion of the nail. The nail bed is clean. No deformity of the finger. Minimal swelling. No ecchymosis. normal sensibility the tip of the finger.  Specialty Comments:  No specialty comments available.  Imaging: No results found.   PMFS History: There are no active problems to display for this patient.  Past Medical History:  Diagnosis Date  . Bronchitis     No family history on file.  Past Surgical History:    Procedure Laterality Date  . CESAREAN SECTION     Social History   Occupational History  . Not on file.   Social History Main Topics  . Smoking status: Current Every Day Smoker    Packs/day: 0.50    Types: Cigarettes  . Smokeless tobacco: Never Used  . Alcohol use No  . Drug use: No  . Sexual activity: Yes    Birth control/ protection: Pill

## 2017-01-26 ENCOUNTER — Ambulatory Visit (INDEPENDENT_AMBULATORY_CARE_PROVIDER_SITE_OTHER): Payer: Medicaid Other | Admitting: Orthopaedic Surgery

## 2017-01-26 ENCOUNTER — Encounter (INDEPENDENT_AMBULATORY_CARE_PROVIDER_SITE_OTHER): Payer: Self-pay | Admitting: Orthopaedic Surgery

## 2017-01-26 VITALS — BP 139/80 | HR 90 | Ht 67.0 in | Wt 174.0 lb

## 2017-01-26 DIAGNOSIS — S62664D Nondisplaced fracture of distal phalanx of right ring finger, subsequent encounter for fracture with routine healing: Secondary | ICD-10-CM | POA: Diagnosis not present

## 2017-01-26 NOTE — Progress Notes (Signed)
Office Visit Note   Patient: Kaylee Short           Date of BErnesta Ambleirth: 02/14/1991           MRN: [id number] Visit Date: 01/26/2017              Requested by: No referring provider defined for this encounter. PCP: No PCP Per Patient   Assessment & Plan: Visit Diagnoses:  1. Closed nondisplaced fracture of distal phalanx of right ring finger with routine healing, subsequent encounter     Plan: Continue dressing changes. Follow-up 2 weeks. We discussed the potential for loss of the nail from the trauma. She'll continue to keep it cleaned as she's been doing dry and dressed.  Follow-Up Instructions: Return in about 2 weeks (around 02/09/2017).   Orders:  No orders of the defined types were placed in this encounter.  No orders of the defined types were placed in this encounter.     Procedures: No procedures performed   Clinical Data: No additional findings.   Subjective: Chief Complaint  Patient presents with  . Right Ring Finger - Fracture, Follow-up    Patient returns for one week follow up comminuted fracture distal phalanx tip with nail avulsion right ring finger. She saw Dr. Cleophas DunkerWhitfield on 01/18/17 and was told to continue Keflex and splint given to her from Urgent Care. She has changed the dressing once and states that there was drainage on the splint. She used peroxide to get the dressing off and recovered with Xeroform, wrapped the splint in gauze, and then reapplied with Coban. She states that she has almost completed the Keflex prescription. Patient is 4 months pregnant.     Review of Systems 14 point review of systems updated she is on Suboxone chronically with pain management. She is albuterol inhaler for asthma uses Reglan also Tylenol. Otherwise review of systems is negative as it pertains to the history of present illness.   Objective: Vital Signs: BP 139/80   Pulse 90   Ht 5\' 7"  (1.702 m)   Wt 174 lb (78.9 kg)   LMP 09/18/2016   BMI 27.25 kg/m    Physical Exam  Constitutional: She is oriented to person, place, and time. She appears well-developed.  HENT:  Head: Normocephalic.  Right Ear: External ear normal.  Left Ear: External ear normal.  Eyes: Pupils are equal, round, and reactive to light.  Small pupils noted bilateral  Neck: No tracheal deviation present. No thyromegaly present.  Cardiovascular: Normal rate.   Pulmonary/Chest: Effort normal.  Abdominal: Soft.  Musculoskeletal:  Right ring finger has a laceration over the dorsum with injury to the nail plate. There is no step-off in the nail plate. She does not have any sutures and is using peroxide to clean the finger. Patient at the tip is slightly diminished. Good capillary refill. Terminal extensor and performed by tendon is active. No cellulitis no epitrochlear nodes. Other fingers show no trauma. Full range of motion of other digits with good extension and flexion.  Neurological: She is alert and oriented to person, place, and time.  Skin: Skin is warm and dry.  Psychiatric: She has a normal mood and affect. Her behavior is normal.    Ortho Exam  Specialty Comments:  No specialty comments available.  Imaging: No results found.   PMFS History: There are no active problems to display for this patient.  Past Medical History:  Diagnosis Date  . Bronchitis     No family history on  file.  Past Surgical History:  Procedure Laterality Date  . CESAREAN SECTION     Social History   Occupational History  . Not on file.   Social History Main Topics  . Smoking status: Current Every Day Smoker    Packs/day: 0.50    Types: Cigarettes  . Smokeless tobacco: Never Used  . Alcohol use No  . Drug use: No  . Sexual activity: Yes    Birth control/ protection: Pill

## 2017-02-02 ENCOUNTER — Encounter (INDEPENDENT_AMBULATORY_CARE_PROVIDER_SITE_OTHER): Payer: Self-pay | Admitting: Orthopaedic Surgery

## 2017-02-02 ENCOUNTER — Ambulatory Visit (INDEPENDENT_AMBULATORY_CARE_PROVIDER_SITE_OTHER): Payer: Medicaid Other | Admitting: Orthopaedic Surgery

## 2017-02-02 VITALS — BP 131/85 | HR 100 | Ht 67.0 in | Wt 172.0 lb

## 2017-02-02 DIAGNOSIS — S62664D Nondisplaced fracture of distal phalanx of right ring finger, subsequent encounter for fracture with routine healing: Secondary | ICD-10-CM | POA: Diagnosis not present

## 2017-02-02 DIAGNOSIS — S62664A Nondisplaced fracture of distal phalanx of right ring finger, initial encounter for closed fracture: Secondary | ICD-10-CM | POA: Insufficient documentation

## 2017-02-02 NOTE — Progress Notes (Signed)
   Office Visit Note   Patient: Kaylee Short           Date of Birth: 07/01/1991           MRN: [id number] Visit Date: 02/02/2017              Requested by: No referring provider defined for this encounter. PCP: No PCP Per Patient   Assessment & Plan: Visit Diagnoses:  1. Closed nondisplaced fracture of distal phalanx of right ring finger with routine healing, subsequent encounter     Plan: Work slip given for resuming work on 02/13/2017. She should develop adhesive tape on her finger and angular handed to the glove this uses a work. She will return if she has persistent problems or like to have a follow-up x-ray.  Follow-Up Instructions: No Follow-up on file.   Orders:  No orders of the defined types were placed in this encounter.  No orders of the defined types were placed in this encounter.     Procedures: No procedures performed   Clinical Data: No additional findings.   Subjective: Chief Complaint  Patient presents with  . Right Ring Finger - Fracture    Patient returns for one week follow up right 4th finger tuft fracture and nail loss. She is here for wound check. Initial injury was 01/17/2017.    Review of Systems 13 previous systems unchanged from last week. She remains out of work due to the finger injury.   Objective: Vital Signs: BP 131/85   Pulse 100   Ht 5\' 7"  (1.702 m)   Wt 172 lb (78 kg)   LMP 09/18/2016   BMI 26.94 kg/m   Physical Exam no cellulitis fingertip. Good capillary refill. She's been cleaning it and dressing it.  Ortho Exam  Specialty Comments:  No specialty comments available.  Imaging: No results found.   PMFS History: Patient Active Problem List   Diagnosis Date Noted  . Closed nondisplaced fracture of distal phalanx of right ring finger 02/02/2017   Past Medical History:  Diagnosis Date  . Bronchitis     No family history on file.  Past Surgical History:  Procedure Laterality Date  . CESAREAN SECTION      Social History   Occupational History  . Not on file.   Social History Main Topics  . Smoking status: Current Every Day Smoker    Packs/day: 0.50    Types: Cigarettes  . Smokeless tobacco: Never Used  . Alcohol use No  . Drug use: No  . Sexual activity: Yes    Birth control/ protection: Pill

## 2017-05-02 MED FILL — SUBOXONE 8 MG-2 MG SL FILM: 8-2 | 28 days supply | Qty: 70 | Fill #0

## 2017-05-30 MED FILL — SUBOXONE 8 MG-2 MG SL FILM: 8-2 | 28 days supply | Qty: 70 | Fill #0

## 2017-06-27 MED FILL — SUBOXONE 8 MG-2 MG SL FILM: 8-2 | 28 days supply | Qty: 70 | Fill #0

## 2017-07-27 MED FILL — SUBOXONE 8 MG-2 MG SL FILM: 8-2 | 28 days supply | Qty: 70 | Fill #0

## 2017-08-19 ENCOUNTER — Encounter (HOSPITAL_COMMUNITY): Payer: Self-pay

## 2017-08-19 ENCOUNTER — Emergency Department (HOSPITAL_COMMUNITY)
Admission: EM | Admit: 2017-08-19 | Discharge: 2017-08-19 | Disposition: A | Discharge status: Home / Self Care | Payer: Medicaid Other | Attending: Emergency Medicine | Admitting: Emergency Medicine

## 2017-08-19 DIAGNOSIS — Z79899 Other long term (current) drug therapy: Secondary | ICD-10-CM | POA: Diagnosis not present

## 2017-08-19 DIAGNOSIS — M545 Low back pain, unspecified: Secondary | ICD-10-CM

## 2017-08-19 DIAGNOSIS — R202 Paresthesia of skin: Secondary | ICD-10-CM | POA: Diagnosis not present

## 2017-08-19 DIAGNOSIS — F17210 Nicotine dependence, cigarettes, uncomplicated: Secondary | ICD-10-CM | POA: Diagnosis not present

## 2017-08-19 MED ORDER — NAPROXEN 500 MG PO TABS: 500.0000 mg | ORAL_TABLET | Freq: Two times a day (BID) | ORAL | 0 refills | Status: DC

## 2017-08-19 MED ORDER — KETOROLAC TROMETHAMINE 60 MG/2ML IM SOLN
60.0000 mg | Freq: Once | INTRAMUSCULAR | Status: AC
  Administered 2017-08-19: 60 mg via INTRAMUSCULAR
  Filled 2017-08-19: qty 2

## 2017-08-19 MED ORDER — CYCLOBENZAPRINE HCL 5 MG PO TABS: 5.0000 mg | ORAL_TABLET | Freq: Three times a day (TID) | ORAL | 0 refills | Status: DC | PRN

## 2017-08-19 NOTE — ED Triage Notes (Signed)
Pt states she had a baby 6weeks ago, had an epidural for same, states she has been having pain to lower back x 2 weeks.  No new injruy

## 2017-08-19 NOTE — ED Provider Notes (Signed)
[facility]-EMERGENCY DEPT Provider Note   CSN: [id number] Arrival date & time: 08/19/17  0156  Time seen 02:28 AM   History   Chief Complaint Chief Complaint  Patient presents with  . Back Pain    HPI Kaylee Short is a 26 y.o. female.  HPI  patient states she is 6 weeks postpartum. She states she started having lumbar back pain about 3 weeks ago. She states that standing, bending, sitting, or straightening her back makes the pain worse, laying flat in bed and sometimes ice makes it feel better. She denies any fever, numbness or tingling of her extremities although one time when she was sitting feeding the baby she had some numbness of her toes just for a few minutes went away when she stood up. She denies any urinary or rectal incontinence. She's been taking Tylenol for pain. She is on Suboxone for history of narcotic abuse. She specifically states she does not want narcotics. She also states she has a area of her right posterior shoulder for about 2 weeks it's like a pinch and tingles. She denies any prior history of back problems.  Patient states that she has been breast-feeding however she is not producing enough milk and they are transitioning her baby over to formula.  PCP [facility] OBGYN Dr Donzetta Matters in [place]   Past Medical History:  Diagnosis Date  . Bronchitis     Patient Active Problem List   Diagnosis Date Noted  . Closed nondisplaced fracture of distal phalanx of right ring finger 02/02/2017    Past Surgical History:  Procedure Laterality Date  . CESAREAN SECTION      OB History    Gravida Para Term Preterm AB Living   3         2   SAB TAB Ectopic Multiple Live Births                   Home Medications    Prior to Admission medications   Medication Sig Start Date End Date Taking? Authorizing Provider  acetaminophen (TYLENOL) 325 MG tablet Take 325-650 mg by mouth every 6 (six) hours as needed.   Yes [provider]    albuterol (PROVENTIL HFA;VENTOLIN HFA) 108 (90 Base) MCG/ACT inhaler Inhale 1-2 puffs into the lungs every 6 (six) hours as needed for wheezing or shortness of breath. 02/19/16  Yes Cheron Schaumann K, PA-C  SUBOXONE 8-2 MG FILM  11/22/16  Yes [provider]  buprenorphine-naloxone (SUBOXONE) 8-2 MG SUBL SL tablet Place 1 tablet under the tongue daily.    [provider]  cyclobenzaprine (FLEXERIL) 5 MG tablet Take 1 tablet (5 mg total) by mouth 3 (three) times daily as needed. 08/19/17   Devoria Albe, MD  metoCLOPramide (REGLAN) 10 MG tablet Take 1 tablet (10 mg total) by mouth every 6 (six) hours. 11/17/16   Eber Hong, MD  naproxen (NAPROSYN) 500 MG tablet Take 1 tablet (500 mg total) by mouth 2 (two) times daily. 08/19/17   Devoria Albe, MD    Family History No family history on file.  Social History Social History  Substance Use Topics  . Smoking status: Current Every Day Smoker    Packs/day: 0.50    Types: Cigarettes  . Smokeless tobacco: Never Used  . Alcohol use No  employed Smokes 1 ppd   Allergies   Patient has no known allergies.   Review of Systems Review of Systems  All other systems reviewed and are negative.  Physical Exam Updated Vital Signs BP (!) 145/92 (BP Location: Left Arm)   Pulse 90   Temp 97.9 F (36.6 C) (Oral)   Resp 18   Ht  (1.702 m)   Wt 72.6 kg (160 lb)   LMP 08/13/2017 (Approximate)   SpO2 99%   Breastfeeding? Unknown   BMI 25.06 kg/m   Physical Exam  Constitutional: She is oriented to person, place, and time. She appears well-developed and well-nourished.  Non-toxic appearance. She does not appear ill. No distress.  HENT:  Head: Normocephalic and atraumatic.  Right Ear: External ear normal.  Left Ear: External ear normal.  Nose: Nose normal. No mucosal edema or rhinorrhea.  Mouth/Throat: Oropharynx is clear and moist and mucous membranes are normal. No dental abscesses or uvula swelling.  Eyes: Pupils are equal,  round, and reactive to light. Conjunctivae and EOM are normal.  Neck: Normal range of motion and full passive range of motion without pain. Neck supple.  Cardiovascular: Normal rate, regular rhythm and normal heart sounds.  Exam reveals no gallop and no friction rub.   No murmur heard. Pulmonary/Chest: Effort normal and breath sounds normal. No respiratory distress. She has no wheezes. She has no rhonchi. She has no rales. She exhibits no tenderness and no crepitus.  Abdominal: Soft. Normal appearance and bowel sounds are normal. She exhibits no distension. There is no tenderness. There is no rebound and no guarding.  Musculoskeletal: Normal range of motion. She exhibits no edema or tenderness.       Back:  Moves all extremities well. Patient is tender diffusely of her whole lumbar spine and the paraspinous muscles bilaterally although they are more tender near the upper levels. She has pain on any range of motion of the number spine. Her patellar reflexes are 2+ and equal bilaterally. She has mild straight leg raising on the right.  Neurological: She is alert and oriented to person, place, and time. She has normal strength. No cranial nerve deficit.  Skin: Skin is warm, dry and intact. No rash noted. No erythema. No pallor.  Psychiatric: She has a normal mood and affect. Her speech is normal and behavior is normal. Her mood appears not anxious.  Nursing note and vitals reviewed.    ED Treatments / Results  Labs (all labs ordered are listed, but only abnormal results are displayed) Labs Reviewed - No data to display  EKG  EKG Interpretation None       Radiology No results found.  Procedures Procedures (including critical care time)  Medications Ordered in ED Medications  ketorolac (TORADOL) injection 60 mg (not administered)     Initial Impression / Assessment and Plan / ED Course  I have reviewed the triage vital signs and the nursing notes.  Pertinent labs & imaging  results that were available during my care of the patient were reviewed by me and considered in my medical decision making (see chart for details).      Patient does not have any alarming symptoms with her acute back pain. She does not have fever, incontinence, or significant numbness. She works 12 hour shifts in the [employer]. Her pain is diffusely in her whole lumbar spine and not localized. Patient drove her self to the ED. She was given Toradol IM prior to discharge. We discussed her treatment would be ice and heat, anti-inflammatory muscle relaxer. We discussed that she should not breast-feed the baby when she takes the Flexeril and she states she's trying to stop breast-feeding  and get him on formula anyway.  Final Clinical Impressions(s) / ED Diagnoses   Final diagnoses:  Acute bilateral low back pain without sciatica    New Prescriptions New Prescriptions   CYCLOBENZAPRINE (FLEXERIL) 5 MG TABLET    Take 1 tablet (5 mg total) by mouth 3 (three) times daily as needed.   NAPROXEN (NAPROSYN) 500 MG TABLET    Take 1 tablet (500 mg total) by mouth 2 (two) times daily.    Plan discharge  Devoria Albe, MD, Concha Pyo, MD 08/19/17 [phone]

## 2017-08-19 NOTE — Discharge Instructions (Signed)
Use ice and heat for comfort. Take the medications as prescribed. Recheck if not improving over the next week. Do not breast-feed while you are taking the Flexeril.

## 2017-08-22 MED FILL — SUBOXONE 8 MG-2 MG SL FILM: 8-2 | 28 days supply | Qty: 70 | Fill #0

## 2017-09-19 MED FILL — SUBOXONE 8 MG-2 MG SL FILM: 8-2 | 28 days supply | Qty: 70 | Fill #0

## 2017-10-17 MED FILL — SUBOXONE 8 MG-2 MG SL FILM: 8-2 | 30 days supply | Qty: 75 | Fill #0

## 2017-11-16 MED FILL — SUBOXONE 8 MG-2 MG SL FILM: 8-2 | 28 days supply | Qty: 70 | Fill #0

## 2017-12-14 MED FILL — SUBOXONE 8 MG-2 MG SL FILM: 8-2 | 28 days supply | Qty: 70 | Fill #0

## 2018-01-11 MED FILL — SUBOXONE 8 MG-2 MG SL FILM: 8-2 | 28 days supply | Qty: 70 | Fill #0

## 2018-02-03 ENCOUNTER — Other Ambulatory Visit: Payer: Self-pay

## 2018-02-03 ENCOUNTER — Emergency Department (HOSPITAL_COMMUNITY): Payer: Medicaid Other

## 2018-02-03 ENCOUNTER — Encounter (HOSPITAL_COMMUNITY): Payer: Self-pay | Admitting: Emergency Medicine

## 2018-02-03 ENCOUNTER — Emergency Department (HOSPITAL_COMMUNITY)
Admission: EM | Admit: 2018-02-03 | Discharge: 2018-02-04 | Disposition: A | Discharge status: Home / Self Care | Payer: Medicaid Other | Attending: Emergency Medicine | Admitting: Emergency Medicine

## 2018-02-03 DIAGNOSIS — K5900 Constipation, unspecified: Secondary | ICD-10-CM | POA: Diagnosis not present

## 2018-02-03 DIAGNOSIS — F17210 Nicotine dependence, cigarettes, uncomplicated: Secondary | ICD-10-CM | POA: Diagnosis not present

## 2018-02-03 DIAGNOSIS — Z79899 Other long term (current) drug therapy: Secondary | ICD-10-CM | POA: Insufficient documentation

## 2018-02-03 DIAGNOSIS — R1031 Right lower quadrant pain: Secondary | ICD-10-CM | POA: Insufficient documentation

## 2018-02-03 DIAGNOSIS — R110 Nausea: Secondary | ICD-10-CM | POA: Insufficient documentation

## 2018-02-03 LAB — LIPASE, BLOOD: Lipase: 27 U/L (ref 11–51)

## 2018-02-03 LAB — CBC
HCT: 44 % (ref 36.0–46.0)
Hemoglobin: 14.4 g/dL (ref 12.0–15.0)
MCH: 29.3 pg (ref 26.0–34.0)
MCHC: 32.7 g/dL (ref 30.0–36.0)
MCV: 89.6 fL (ref 78.0–100.0)
PLATELETS: 160 10*3/uL (ref 150–400)
RBC: 4.91 MIL/uL (ref 3.87–5.11)
RDW: 12.3 % (ref 11.5–15.5)
WBC: 4.8 10*3/uL (ref 4.0–10.5)

## 2018-02-03 LAB — COMPREHENSIVE METABOLIC PANEL
ALT: 17 U/L (ref 14–54)
ANION GAP: 11 (ref 5–15)
AST: 18 U/L (ref 15–41)
Albumin: 4.9 g/dL (ref 3.5–5.0)
Alkaline Phosphatase: 54 U/L (ref 38–126)
BILIRUBIN TOTAL: 1.5 mg/dL — AB (ref 0.3–1.2)
BUN: 13 mg/dL (ref 6–20)
CALCIUM: 9.9 mg/dL (ref 8.9–10.3)
CO2: 25 mmol/L (ref 22–32)
CREATININE: 0.72 mg/dL (ref 0.44–1.00)
Chloride: 104 mmol/L (ref 101–111)
GFR calc non Af Amer: >60 mL/min (ref >60)
Glucose, Bld: 98 mg/dL (ref 65–99)
Potassium: 4.1 mmol/L (ref 3.5–5.1)
SODIUM: 140 mmol/L (ref 135–145)
Total Protein: 8.3 g/dL — ABNORMAL HIGH (ref 6.5–8.1)

## 2018-02-03 LAB — HCG, SERUM, QUALITATIVE: Preg, Serum: NEGATIVE

## 2018-02-03 LAB — URINALYSIS, ROUTINE W REFLEX MICROSCOPIC
Bacteria, UA: NONE SEEN
GLUCOSE, UA: NEGATIVE mg/dL
HGB URINE DIPSTICK: NEGATIVE
KETONES UR: 5 mg/dL — AB
Leukocytes, UA: NEGATIVE
NITRITE: NEGATIVE
PH: 5 (ref 5.0–8.0)
Protein, ur: 30 mg/dL — AB
RBC / HPF: NONE SEEN RBC/hpf (ref 0–5)
Specific Gravity, Urine: 1.035 — ABNORMAL HIGH (ref 1.005–1.030)

## 2018-02-03 MED ORDER — ONDANSETRON HCL 4 MG/2ML IJ SOLN
4.0000 mg | Freq: Once | INTRAMUSCULAR | Status: AC
  Administered 2018-02-03: 4 mg via INTRAVENOUS
  Filled 2018-02-03: qty 2

## 2018-02-03 MED ORDER — IOPAMIDOL (ISOVUE-300) INJECTION 61%
100.0000 mL | Freq: Once | INTRAVENOUS | Status: AC | PRN
  Administered 2018-02-03: 100 mL via INTRAVENOUS

## 2018-02-03 MED ORDER — KETOROLAC TROMETHAMINE 30 MG/ML IJ SOLN
30.0000 mg | Freq: Once | INTRAMUSCULAR | Status: AC
  Administered 2018-02-03: 30 mg via INTRAVENOUS
  Filled 2018-02-03: qty 1

## 2018-02-03 MED ORDER — SODIUM CHLORIDE 0.9 % IV BOLUS (SEPSIS)
500.0000 mL | Freq: Once | INTRAVENOUS | Status: AC
  Administered 2018-02-03: 500 mL via INTRAVENOUS

## 2018-02-03 MED ORDER — HYDROMORPHONE HCL 1 MG/ML IJ SOLN: 1.0000 mg | Freq: Once | INTRAMUSCULAR | Status: DC

## 2018-02-03 MED ORDER — SODIUM CHLORIDE 0.9 % IV SOLN
INTRAVENOUS | Status: DC
  Administered 2018-02-03: 23:00:00 via INTRAVENOUS

## 2018-02-03 NOTE — ED Triage Notes (Signed)
Patient reports R lower quadrant abdominal pain, onset this am. Patient reports some nausea but no emesis or diarrhea. Increased pain with sitting. Denies urinary symptoms. Walking increases pain.

## 2018-02-03 NOTE — ED Notes (Signed)
Pt reminded of importance of urine sample. Pt provided a small amount of urine, was unable to send to lab.

## 2018-02-04 MED ORDER — POLYETHYLENE GLYCOL 3350 17 G PO PACK: 17.0000 g | PACK | Freq: Every day | ORAL | 0 refills | Status: DC

## 2018-02-04 NOTE — ED Provider Notes (Signed)
[facility] EMERGENCY DEPARTMENT Provider Note   CSN: [id number] Arrival date & time: 02/03/18  2026     History   Chief Complaint Chief Complaint  Patient presents with  . Abdominal Pain    HPI Kaylee Short is a 27 y.o. female.  Patient with acute onset of right lower quadrant abdominal pain at 1500 today.  Associated with nausea no vomiting no diarrhea no dysuria no urinary frequency.  No radiation of the pain to the back.  Patient has not had pain like this before.  Made worse by walking.  Patient's past surgical history is significant for cesarean section.     Past Medical History:  Diagnosis Date  . Bronchitis     Patient Active Problem List   Diagnosis Date Noted  . Closed nondisplaced fracture of distal phalanx of right ring finger 02/02/2017    Past Surgical History:  Procedure Laterality Date  . CESAREAN SECTION       OB History    Gravida  3   Para  3   Term  2   Preterm  1   AB      Living  2     SAB      TAB      Ectopic      Multiple      Live Births               Home Medications    Prior to Admission medications   Medication Sig Start Date End Date Taking? Authorizing Provider  amLODipine (NORVASC) 5 MG tablet Take 5 mg by mouth daily. 01/01/18 01/01/19 Yes [provider]  buprenorphine-naloxone (SUBOXONE) 8-2 MG SUBL SL tablet Place 1 tablet under the tongue daily.   Yes [provider]  albuterol (PROVENTIL HFA;VENTOLIN HFA) 108 (90 Base) MCG/ACT inhaler Inhale 1-2 puffs into the lungs every 6 (six) hours as needed for wheezing or shortness of breath. 02/19/16   Elson Areas, PA-C  polyethylene glycol [facility]) packet Take 17 g by mouth daily. 02/04/18   Vanetta Mulders, MD    Family History History reviewed. No pertinent family history.  Social History Social History   Tobacco Use  . Smoking status: Current Every Day Smoker    Packs/day: 1.00    Types: Cigarettes  . Smokeless tobacco:  Never Used  Substance Use Topics  . Alcohol use: No  . Drug use: No     Allergies   Patient has no known allergies.   Review of Systems Review of Systems  Constitutional: Negative for fever.  HENT: Negative for congestion.   Eyes: Negative for redness.  Respiratory: Negative for shortness of breath.   Cardiovascular: Negative for chest pain.  Gastrointestinal: Positive for abdominal pain and nausea. Negative for diarrhea and vomiting.  Genitourinary: Negative for dysuria, frequency, vaginal bleeding and vaginal discharge.  Musculoskeletal: Negative for back pain.  Skin: Negative for rash.  Neurological: Negative for headaches.  Hematological: Does not bruise/bleed easily.  Psychiatric/Behavioral: Negative for confusion.     Physical Exam Updated Vital Signs BP 132/85 (BP Location: Right Arm)   Pulse 63   Temp 98.2 F (36.8 C) (Oral)   Resp 18   Ht 1.715 m (5' 7.5")   Wt 78.2 kg (172 lb 5 oz)   LMP 01/27/2018   SpO2 99%   BMI 26.59 kg/m   Physical Exam  Constitutional: She is oriented to person, place, and time. She appears well-developed and well-nourished. No distress.  HENT:  Head:  Normocephalic and atraumatic.  Mouth/Throat: Oropharynx is clear and moist.  Eyes: Pupils are equal, round, and reactive to light. Conjunctivae and EOM are normal.  Neck: Normal range of motion. Neck supple.  Cardiovascular: Normal rate, regular rhythm and normal heart sounds.  Pulmonary/Chest: Effort normal and breath sounds normal. No respiratory distress.  Abdominal: Soft. Bowel sounds are normal. There is tenderness. There is no guarding.  Tenderness right lower quadrant with some guarding.  Musculoskeletal: Normal range of motion. She exhibits no edema.  Neurological: She is alert and oriented to person, place, and time. No cranial nerve deficit or sensory deficit. She exhibits normal muscle tone. Coordination normal.  Skin: Skin is warm. No rash noted.  Nursing note and  vitals reviewed.    ED Treatments / Results  Labs (all labs ordered are listed, but only abnormal results are displayed) Labs Reviewed  COMPREHENSIVE METABOLIC PANEL - Abnormal; Notable for the following components:      Result Value   Total Protein 8.3 (*)    Total Bilirubin 1.5 (*)    All other components within normal limits  URINALYSIS, ROUTINE W REFLEX MICROSCOPIC - Abnormal; Notable for the following components:   Color, Urine AMBER (*)    APPearance HAZY (*)    Specific Gravity, Urine 1.035 (*)    Bilirubin Urine SMALL (*)    Ketones, ur 5 (*)    Protein, ur 30 (*)    Squamous Epithelial / LPF 0-5 (*)    All other components within normal limits  LIPASE, BLOOD  CBC  HCG, SERUM, QUALITATIVE    EKG None  Radiology Ct Abdomen Pelvis W Contrast  Result Date: 02/03/2018 CLINICAL DATA:  Right lower quadrant abdominal pain onset this morning with nausea. EXAM: CT ABDOMEN AND PELVIS WITH CONTRAST TECHNIQUE: Multidetector CT imaging of the abdomen and pelvis was performed using the standard protocol following bolus administration of intravenous contrast. CONTRAST:  ISOVUE-300 IOPAMIDOL (ISOVUE-300) INJECTION 61% COMPARISON:  11/11/2015 FINDINGS: Lower chest: Normal heart size without pericardial effusion. Dependent atelectasis at the lung bases. Hepatobiliary: No focal liver abnormality is seen. No gallstones, gallbladder wall thickening, or biliary dilatation. Pancreas: Unremarkable. No pancreatic ductal dilatation or surrounding inflammatory changes. Spleen: The spleen mildly enlarged measuring 13.9 x 7.2 x 11.8 cm (volume = 620 cm^3) but is without focal mass. Adrenals/Urinary Tract: No adrenal mass. Symmetric enhancement of both kidneys with small extrarenal pelves bilaterally. No hydroureteronephrosis. Unremarkable bladder for degree of distention. Stomach/Bowel: The stomach is decompressed in appearance. Normal small bowel rotation and ligament of Treitz position. Moderate  fecal retention throughout the colon without acute bowel obstruction or inflammation. Colonic diverticulosis is noted along the descending sigmoid colon. No acute diverticulitis. Normal-appearing appendix. Vascular/Lymphatic: No significant vascular findings are present. No enlarged abdominal or pelvic lymph nodes. Reproductive: Nabothian cysts are noted at the cervix. No uterine mass. No adnexal abnormality. Other: No free air nor free fluid. Musculoskeletal: No acute or significant osseous findings. IMPRESSION: 1. Increased colonic stool burden consistent with constipation. 2. Colonic diverticulosis without acute diverticulitis. 3. Stable mild splenomegaly. 4. Normal-appearing appendix. Electronically Signed   By: Tollie Eth M.D.   On: 02/03/2018 23:50    Procedures Procedures (including critical care time)  Medications Ordered in ED Medications  0.9 %  sodium chloride infusion ( Intravenous New Bag/Given 02/03/18 2310)  ondansetron (ZOFRAN) injection 4 mg (4 mg Intravenous Given 02/03/18 2239)  sodium chloride 0.9 % bolus 500 mL (0 mLs Intravenous Stopped 02/03/18 2310)  iopamidol (ISOVUE-300)  61 % injection 100 mL (100 mLs Intravenous Contrast Given 02/03/18 2316)  ketorolac (TORADOL) 30 MG/ML injection 30 mg (30 mg Intravenous Given 02/03/18 2239)     Initial Impression / Assessment and Plan / ED Course  I have reviewed the triage vital signs and the nursing notes.  Pertinent labs & imaging results that were available during my care of the patient were reviewed by me and considered in my medical decision making (see chart for details).     Workup for the right lower quadrant abdominal pain without any significant findings.  Pregnancy test negative.  Labs normal.  Urinalysis negative for urinary tract infection.  CT scan shows evidence of constipation.  Adnexa uterus without any acute findings.  Patient was treated with MiraLAX for the constipation and follow-up with her OB/GYN doctor if  symptoms do not resolve.  No evidence of appendicitis.  Final Clinical Impressions(s) / ED Diagnoses   Final diagnoses:  Right lower quadrant abdominal pain  Constipation, unspecified constipation type    ED Discharge Orders        Ordered    polyethylene glycol (MIRALAX) packet  Daily     02/04/18 0011       Vanetta MuldersZackowski, Layson Bertsch, MD 02/04/18 [phone]

## 2018-02-04 NOTE — Discharge Instructions (Addendum)
CT scan without any acute findings.  Did show some constipation.  Recommend taking MiraLAX for the next 7 days.  Work note provided.  Return for any new or worse symptoms.  If symptoms persist follow-up with your OB/GYN doctor.

## 2018-02-08 MED FILL — SUBOXONE 8 MG-2 MG SL FILM: 8-2 | 16 days supply | Qty: 42 | Fill #0

## 2018-02-08 MED FILL — AMITIZA 24 MCG CAPSULES: 24 | 30 days supply | Qty: 60 | Fill #0

## 2018-02-23 MED FILL — ZUBSOLV 5.7-1.4 MG TAB SL: 5.7-1.4 | 7 days supply | Qty: 15 | Fill #0

## 2018-03-06 MED FILL — ZUBSOLV 5.7-1.4 MG TAB SL: 5.7-1.4 | 7 days supply | Qty: 15 | Fill #0

## 2018-03-15 MED FILL — SUBOXONE 8 MG-2 MG SL FILM: 8-2 | 28 days supply | Qty: 70 | Fill #0

## 2018-04-12 MED FILL — SUBOXONE 8 MG-2 MG SL FILM: 8-2 | 28 days supply | Qty: 70 | Fill #0

## 2018-04-12 MED FILL — AMITIZA 24 MCG CAPSULES: 24 | 30 days supply | Qty: 60 | Fill #1

## 2018-05-10 MED FILL — SUBOXONE 8 MG-2 MG SL FILM: 8-2 | 28 days supply | Qty: 70 | Fill #0

## 2018-06-07 MED FILL — SUBOXONE 8 MG-2 MG SL FILM: 8-2 | 28 days supply | Qty: 70 | Fill #0

## 2018-07-05 MED FILL — SUBOXONE 8 MG-2 MG SL FILM: 8-2 | 28 days supply | Qty: 70 | Fill #0

## 2018-09-06 MED FILL — SUBOXONE 8 MG-2 MG SL FILM: 8-2 | 28 days supply | Qty: 70 | Fill #0

## 2018-10-19 ENCOUNTER — Other Ambulatory Visit: Payer: Self-pay

## 2018-10-19 ENCOUNTER — Encounter (HOSPITAL_COMMUNITY): Payer: Self-pay

## 2018-10-19 ENCOUNTER — Emergency Department (HOSPITAL_COMMUNITY)
Admission: EM | Admit: 2018-10-19 | Discharge: 2018-10-19 | Disposition: A | Discharge status: Home / Self Care | Payer: Medicaid Other | Attending: Emergency Medicine | Admitting: Emergency Medicine

## 2018-10-19 DIAGNOSIS — Z79899 Other long term (current) drug therapy: Secondary | ICD-10-CM | POA: Insufficient documentation

## 2018-10-19 DIAGNOSIS — F17210 Nicotine dependence, cigarettes, uncomplicated: Secondary | ICD-10-CM | POA: Insufficient documentation

## 2018-10-19 DIAGNOSIS — N611 Abscess of the breast and nipple: Secondary | ICD-10-CM | POA: Insufficient documentation

## 2018-10-19 MED ORDER — DOXYCYCLINE HYCLATE 100 MG PO CAPS: 100.0000 mg | ORAL_CAPSULE | Freq: Two times a day (BID) | ORAL | 0 refills | Status: DC

## 2018-10-19 MED ORDER — KETOROLAC TROMETHAMINE 10 MG PO TABS
10.0000 mg | ORAL_TABLET | Freq: Once | ORAL | Status: AC
  Administered 2018-10-19: 10 mg via ORAL
  Filled 2018-10-19: qty 1

## 2018-10-19 MED ORDER — DOXYCYCLINE HYCLATE 100 MG PO TABS
100.0000 mg | ORAL_TABLET | Freq: Once | ORAL | Status: AC
  Administered 2018-10-19: 100 mg via ORAL
  Filled 2018-10-19: qty 1

## 2018-10-19 MED ORDER — ONDANSETRON HCL 4 MG PO TABS
4.0000 mg | ORAL_TABLET | Freq: Once | ORAL | Status: AC
  Administered 2018-10-19: 4 mg via ORAL
  Filled 2018-10-19: qty 1

## 2018-10-19 MED ORDER — HYDROCODONE-ACETAMINOPHEN 5-325 MG PO TABS: 1.0000 | ORAL_TABLET | ORAL | 0 refills | Status: DC | PRN

## 2018-10-19 MED ORDER — DICLOFENAC SODIUM 75 MG PO TBEC: 75.0000 mg | DELAYED_RELEASE_TABLET | Freq: Two times a day (BID) | ORAL | 0 refills | Status: DC

## 2018-10-19 NOTE — Discharge Instructions (Addendum)
Your vital signs are within normal limits.  Your examination favors an abscess of the right breast.  Please use warm tub soaks to this area daily until seen by the surgeon.  Please see Dr. Henreitta LeberBridges the surgical specialist as soon as possible concerning this infection.  Please use doxycycline 2 times daily, use diclofenac and Norco for pain.  Norco may cause drowsiness, please do not drive a vehicle, operate machinery, drink alcohol, participate in activities requiring concentration when taking this medication. Return to the Emergency Dept if any changes or emergent problem.

## 2018-10-19 NOTE — ED Triage Notes (Signed)
Pt has an abscess next to right nipple that has been growing for a week. Painful to the area. No drainage, no fevers or chills.

## 2018-10-19 NOTE — ED Provider Notes (Signed)
[facility] EMERGENCY DEPARTMENT Provider Note   CSN: [id number] Arrival date & time: 10/19/18  0827     History   Chief Complaint No chief complaint on file.   HPI Kaylee Short is a 27 y.o. female.  Patient is a 27 year old female who presents to the emergency department with complaint of right breast pain.  The patient states that she has noted an abscess of the right nipple area.  She says that over the past week it has been getting larger and more more painful.  She has not had nausea vomiting.  She has not measured her temperature elevation.  She has not noted any red streaking.  She has no medical issues that would affect her immune system.  The history is provided by the patient.    Past Medical History:  Diagnosis Date  . Bronchitis     Patient Active Problem List   Diagnosis Date Noted  . Closed nondisplaced fracture of distal phalanx of right ring finger 02/02/2017    Past Surgical History:  Procedure Laterality Date  . CESAREAN SECTION       OB History    Gravida  3   Para  3   Term  2   Preterm  1   AB      Living  2     SAB      TAB      Ectopic      Multiple      Live Births               Home Medications    Prior to Admission medications   Medication Sig Start Date End Date Taking? Authorizing Provider  albuterol (PROVENTIL HFA;VENTOLIN HFA) 108 (90 Base) MCG/ACT inhaler Inhale 1-2 puffs into the lungs every 6 (six) hours as needed for wheezing or shortness of breath. 02/19/16   Elson Areas, PA-C  amLODipine (NORVASC) 5 MG tablet Take 5 mg by mouth daily. 01/01/18 01/01/19  [provider]  buprenorphine-naloxone (SUBOXONE) 8-2 MG SUBL SL tablet Place 1 tablet under the tongue daily.    [provider]  polyethylene glycol (MIRALAX) packet Take 17 g by mouth daily. 02/04/18   Vanetta Mulders, MD    Family History No family history on file.  Social History Social History   Tobacco Use  . Smoking  status: Current Every Day Smoker    Packs/day: 1.00    Types: Cigarettes  . Smokeless tobacco: Never Used  Substance Use Topics  . Alcohol use: No  . Drug use: No     Allergies   Patient has no known allergies.   Review of Systems Review of Systems  Constitutional: Negative for activity change.       All ROS Neg except as noted in HPI  HENT: Negative for nosebleeds.   Eyes: Negative for photophobia and discharge.  Respiratory: Negative for cough, shortness of breath and wheezing.   Cardiovascular: Negative for chest pain and palpitations.  Gastrointestinal: Negative for abdominal pain and blood in stool.  Genitourinary: Negative for dysuria, frequency and hematuria.  Musculoskeletal: Negative for arthralgias, back pain and neck pain.  Skin: Negative.   Neurological: Negative for dizziness, seizures and speech difficulty.  Psychiatric/Behavioral: Negative for confusion and hallucinations.     Physical Exam Updated Vital Signs BP 135/90 (BP Location: Left Arm)   Pulse 88   Temp 97.9 F (36.6 C) (Oral)   Resp 12   Ht 5\' 7"  (1.702 m)   Wt  81.6 kg   LMP 10/19/2018   SpO2 100%   BMI 28.19 kg/m   Physical Exam  Constitutional: She is oriented to person, place, and time. She appears well-developed and well-nourished.  Non-toxic appearance.  HENT:  Head: Normocephalic.  Right Ear: Tympanic membrane and external ear normal.  Left Ear: Tympanic membrane and external ear normal.  Eyes: Pupils are equal, round, and reactive to light. EOM and lids are normal.  Neck: Normal range of motion. Neck supple. Carotid bruit is not present.  Cardiovascular: Normal rate, regular rhythm, normal heart sounds, intact distal pulses and normal pulses.  Chaperone present during the examination.  There is a red raised abscess area of the right breast just below the nipple area.  There is increased redness/darkness of the Aurelia.  There is tenderness from the 10:00 to 12 o'clock position  just beyond the areola.  There are no red streaks going up the breast.  There is no drainage from the breast.  There are no palpable nodes in the axilla.  Pulmonary/Chest: Breath sounds normal. No respiratory distress.  Abdominal: Soft. Bowel sounds are normal. There is no tenderness. There is no guarding.  Musculoskeletal: Normal range of motion.  Lymphadenopathy:       Head (right side): No submandibular adenopathy present.       Head (left side): No submandibular adenopathy present.    She has no cervical adenopathy.  Neurological: She is alert and oriented to person, place, and time. She has normal strength. No cranial nerve deficit or sensory deficit.  Skin: Skin is warm and dry.  Psychiatric: She has a normal mood and affect. Her speech is normal.  Nursing note and vitals reviewed.    ED Treatments / Results  Labs (all labs ordered are listed, but only abnormal results are displayed) Labs Reviewed - No data to display  EKG None  Radiology No results found.  Procedures Procedures (including critical care time)  Medications Ordered in ED Medications - No data to display   Initial Impression / Assessment and Plan / ED Course  I have reviewed the triage vital signs and the nursing notes.  Pertinent labs & imaging results that were available during my care of the patient were reviewed by me and considered in my medical decision making (see chart for details).      Final Clinical Impressions(s) / ED Diagnoses MDM  Vital signs within normal limits.  Pulse oximetry is 100% on room air.  Within normal limits by my interpretation.  The patient has an abscess area just below the nipple on the right breast.  There are no red streaks appreciated, and there is no drainage from the nipple area.  There are no temperature abnormalities of the right compared to the left breast.  Patient will be placed on antibiotics and medication for pain.  Patient is referred to Dr. Henreitta Leber for  surgical evaluation and management of this issue.   Final diagnoses:  Abscess of breast, right    ED Discharge Orders         Ordered    doxycycline (VIBRAMYCIN) 100 MG capsule  2 times daily     10/19/18 1208    HYDROcodone-acetaminophen (NORCO/VICODIN) 5-325 MG tablet  Every 4 hours PRN     10/19/18 1208    diclofenac (VOLTAREN) 75 MG EC tablet  2 times daily     10/19/18 1208           Ivery Quale, PA-C 10/19/18 1210  Samuel JesterMcManus, Kathleen, DO 10/21/18 [id number]

## 2018-11-20 ENCOUNTER — Other Ambulatory Visit: Payer: Self-pay

## 2018-11-20 ENCOUNTER — Encounter (HOSPITAL_COMMUNITY): Payer: Self-pay

## 2018-11-20 ENCOUNTER — Emergency Department (HOSPITAL_COMMUNITY)
Admission: EM | Admit: 2018-11-20 | Discharge: 2018-11-20 | Disposition: A | Discharge status: Home / Self Care | Payer: Self-pay | Attending: Emergency Medicine | Admitting: Emergency Medicine

## 2018-11-20 DIAGNOSIS — F17210 Nicotine dependence, cigarettes, uncomplicated: Secondary | ICD-10-CM | POA: Insufficient documentation

## 2018-11-20 DIAGNOSIS — Z79899 Other long term (current) drug therapy: Secondary | ICD-10-CM | POA: Insufficient documentation

## 2018-11-20 DIAGNOSIS — J111 Influenza due to unidentified influenza virus with other respiratory manifestations: Secondary | ICD-10-CM

## 2018-11-20 LAB — GROUP A STREP BY PCR: Group A Strep by PCR: NOT DETECTED

## 2018-11-20 MED ORDER — OSELTAMIVIR PHOSPHATE 75 MG PO CAPS: 75.0000 mg | ORAL_CAPSULE | Freq: Two times a day (BID) | ORAL | 0 refills | Status: DC

## 2018-11-20 MED ORDER — ACETAMINOPHEN 500 MG PO TABS
1000.0000 mg | ORAL_TABLET | Freq: Once | ORAL | Status: AC
Start: 2018-11-20 — End: 2018-11-20
  Administered 2018-11-20: 1000 mg via ORAL
  Filled 2018-11-20: qty 2

## 2018-11-20 MED ORDER — ONDANSETRON 4 MG PO TBDP
4.0000 mg | ORAL_TABLET | Freq: Once | ORAL | Status: AC
  Administered 2018-11-20: 4 mg via ORAL
  Filled 2018-11-20: qty 1

## 2018-11-20 MED ORDER — OSELTAMIVIR PHOSPHATE 75 MG PO CAPS
75.0000 mg | ORAL_CAPSULE | Freq: Once | ORAL | Status: AC
  Administered 2018-11-20: 75 mg via ORAL
  Filled 2018-11-20: qty 1

## 2018-11-20 NOTE — Discharge Instructions (Signed)
Your examination favors influenza today.  Please use your mask until symptoms have resolved.  Please wash hands frequently.  Please increase water, juices, Kool-Aid, Gatorade's.  Please have all family members and those around you wash hands frequently.  Please use Tylenol every 4 hours, or ibuprofen every 6 hours for fever, and/or aching.  Please use Tamiflu 2 times daily until all taken.  Please see your primary physician or return to the emergency department if not improving.

## 2018-11-20 NOTE — ED Provider Notes (Signed)
[facility] EMERGENCY DEPARTMENT Provider Note   CSN: [id number] Arrival date & time: 11/20/18  0754     History   Chief Complaint Chief Complaint  Patient presents with  . Cough  . Sore Throat    HPI Kaylee Short is a 28 y.o. female.  The history is provided by the patient.  Sore Throat  This is a new problem. The current episode started 2 days ago. The problem occurs constantly. The problem has not changed since onset.Associated symptoms include headaches. Pertinent negatives include no chest pain, no abdominal pain and no shortness of breath. The symptoms are aggravated by swallowing. Nothing relieves the symptoms. She has tried acetaminophen for the symptoms. The treatment provided no relief.    Past Medical History:  Diagnosis Date  . Bronchitis     Patient Active Problem List   Diagnosis Date Noted  . Closed nondisplaced fracture of distal phalanx of right ring finger 02/02/2017    Past Surgical History:  Procedure Laterality Date  . CESAREAN SECTION       OB History    Gravida  3   Para  3   Term  2   Preterm  1   AB      Living  2     SAB      TAB      Ectopic      Multiple      Live Births               Home Medications    Prior to Admission medications   Medication Sig Start Date End Date Taking? Authorizing Provider  albuterol (PROVENTIL HFA;VENTOLIN HFA) 108 (90 Base) MCG/ACT inhaler Inhale 1-2 puffs into the lungs every 6 (six) hours as needed for wheezing or shortness of breath. 02/19/16   Elson Areas, PA-C  amLODipine (NORVASC) 5 MG tablet Take 5 mg by mouth daily. 01/01/18 01/01/19  [provider]  buprenorphine-naloxone (SUBOXONE) 8-2 MG SUBL SL tablet Place 1 tablet under the tongue daily.    [provider]  diclofenac (VOLTAREN) 75 MG EC tablet Take 1 tablet (75 mg total) by mouth 2 (two) times daily. 10/19/18   Ivery Quale, PA-C  doxycycline (VIBRAMYCIN) 100 MG capsule Take 1 capsule (100 mg  total) by mouth 2 (two) times daily. 10/19/18   Ivery Quale, PA-C  HYDROcodone-acetaminophen (NORCO/VICODIN) 5-325 MG tablet Take 1 tablet by mouth every 4 (four) hours as needed. 10/19/18   Ivery Quale, PA-C  polyethylene glycol [facility]) packet Take 17 g by mouth daily. 02/04/18   Vanetta Mulders, MD    Family History No family history on file.  Social History Social History   Tobacco Use  . Smoking status: Current Every Day Smoker    Packs/day: 1.00    Types: Cigarettes  . Smokeless tobacco: Never Used  Substance Use Topics  . Alcohol use: No  . Drug use: No     Allergies   Patient has no known allergies.   Review of Systems Review of Systems  Constitutional: Positive for chills and fever. Negative for activity change.       All ROS Neg except as noted in HPI  HENT: Positive for congestion and sore throat. Negative for nosebleeds.   Eyes: Negative for photophobia and discharge.  Respiratory: Positive for cough. Negative for shortness of breath and wheezing.   Cardiovascular: Negative for chest pain and palpitations.  Gastrointestinal: Negative for abdominal pain and blood in stool.  Genitourinary: Negative for  dysuria, frequency and hematuria.  Musculoskeletal: Positive for myalgias. Negative for arthralgias, back pain and neck pain.  Skin: Negative.   Neurological: Positive for headaches. Negative for dizziness, seizures and speech difficulty.  Psychiatric/Behavioral: Negative for confusion and hallucinations.     Physical Exam Updated Vital Signs BP (!) 159/99 (BP Location: Right Arm)   Pulse (!) 123   Temp 99.8 F (37.7 C) (Oral)   Resp 20   Ht 5\' 7"  (1.702 m)   Wt 77.1 kg   LMP 11/17/2018   SpO2 99%   Breastfeeding No   BMI 26.63 kg/m   Physical Exam Vitals signs and nursing note reviewed.  Constitutional:      Appearance: She is well-developed. She is not toxic-appearing.  HENT:     Head: Normocephalic.     Right Ear: Tympanic membrane and  external ear normal.     Left Ear: Tympanic membrane and external ear normal.     Nose:     Comments: Nasal congestion.    Mouth/Throat:     Mouth: Mucous membranes are moist.     Palate: Palate elevates in midline.     Pharynx: Uvula midline. Posterior oropharyngeal erythema present.  Eyes:     General: Lids are normal.     Pupils: Pupils are equal, round, and reactive to light.  Neck:     Musculoskeletal: Normal range of motion and neck supple.     Vascular: No carotid bruit.  Cardiovascular:     Rate and Rhythm: Normal rate and regular rhythm.     Pulses: Normal pulses.     Heart sounds: Normal heart sounds.  Pulmonary:     Effort: No respiratory distress.     Breath sounds: Normal breath sounds.  Abdominal:     General: Bowel sounds are normal.     Palpations: Abdomen is soft.     Tenderness: There is no abdominal tenderness. There is no guarding.  Musculoskeletal: Normal range of motion.  Lymphadenopathy:     Head:     Right side of head: No submandibular adenopathy.     Left side of head: No submandibular adenopathy.     Cervical: No cervical adenopathy.  Skin:    General: Skin is warm and dry.  Neurological:     Mental Status: She is alert and oriented to person, place, and time.     Cranial Nerves: No cranial nerve deficit.     Sensory: No sensory deficit.  Psychiatric:        Speech: Speech normal.      ED Treatments / Results  Labs (all labs ordered are listed, but only abnormal results are displayed) Labs Reviewed - No data to display  EKG None  Radiology No results found.  Procedures Procedures (including critical care time)  Medications Ordered in ED Medications - No data to display   Initial Impression / Assessment and Plan / ED Course  I have reviewed the triage vital signs and the nursing notes.  Pertinent labs & imaging results that were available during my care of the patient were reviewed by me and considered in my medical decision  making (see chart for details).       Final Clinical Impressions(s) / ED Diagnoses MDM  Temperature ranging from 99.8-100.5.  Pulse rate elevated at 123.  The pulse oximetry is 99% on room air.  Which is within normal limits by my interpretation.  The history and examination favor influenza.  The patient states that she has had  a little bit of sore throat and was asked by family to be checked for strep.  Strep test is negative.  I have informed the patient that her examination favors influenza.  I discussed with the patient the contagious nature of this illness.  We discussed good handwashing, and good hydration.  She will use Tylenol every 4 hours or ibuprofen every 6 hours for fever and aching. Rx for tamiflu given to the  Patient.   Final diagnoses:  Influenza    ED Discharge Orders         Ordered    oseltamivir (TAMIFLU) 75 MG capsule  Every 12 hours     11/20/18 0950           Ivery QualeBryant, Cari Burgo, PA-C 11/20/18 2247    Doug SouJacubowitz, Sam, MD 11/21/18 [phone]

## 2018-11-20 NOTE — ED Triage Notes (Signed)
Pt reports cough for the past 1 1/2 months.  C/o fever, sore throat, and body aches x 2 days.

## 2019-01-03 MED FILL — SUBOXONE 8 MG-2 MG SL FILM: 8-2 | 14 days supply | Qty: 35 | Fill #0

## 2019-01-17 MED FILL — SUBOXONE 8 MG-2 MG SL FILM: 8-2 | 28 days supply | Qty: 70 | Fill #0

## 2019-04-11 MED FILL — SUBOXONE 8 MG-2 MG SL FILM: 8-2 | 28 days supply | Qty: 70 | Fill #0

## 2019-05-09 MED FILL — SUBOXONE 8 MG-2 MG SL FILM: 8-2 | 28 days supply | Qty: 70 | Fill #0

## 2019-06-06 MED FILL — SUBOXONE 8 MG-2 MG SL FILM: 8-2 | 28 days supply | Qty: 70 | Fill #0

## 2019-07-04 MED FILL — SUBOXONE 8 MG-2 MG SL FILM: 8-2 | 28 days supply | Qty: 70 | Fill #0

## 2019-08-08 MED FILL — SUBOXONE 8 MG-2 MG SL FILM: 8-2 | 28 days supply | Qty: 70 | Fill #0

## 2019-09-05 MED FILL — SUBOXONE 8 MG-2 MG SL FILM: 8-2 | 28 days supply | Qty: 70 | Fill #0

## 2019-10-03 MED FILL — SUBOXONE 8 MG-2 MG SL FILM: 8-2 | 28 days supply | Qty: 70 | Fill #0

## 2019-10-31 MED FILL — SUBOXONE 8 MG-2 MG SL FILM: 8-2 | 28 days supply | Qty: 70 | Fill #0

## 2019-11-28 MED FILL — SUBOXONE 8 MG-2 MG SL FILM: 8-2 | 28 days supply | Qty: 70 | Fill #0

## 2019-12-26 MED FILL — SUBOXONE 8 MG-2 MG SL FILM: 8-2 | 28 days supply | Qty: 70 | Fill #0

## 2020-01-23 MED FILL — SUBOXONE 8 MG-2 MG SL FILM: 8-2 | 28 days supply | Qty: 70 | Fill #0

## 2020-02-18 MED FILL — SUBOXONE 8 MG-2 MG SL FILM: 8-2 | 28 days supply | Qty: 70 | Fill #0

## 2020-03-17 MED FILL — SUBOXONE 8 MG-2 MG SL FILM: 8-2 | 28 days supply | Qty: 70 | Fill #0

## 2020-04-14 MED FILL — SUBOXONE 8 MG-2 MG SL FILM: 8-2 | 28 days supply | Qty: 70 | Fill #0

## 2020-05-10 ENCOUNTER — Encounter (HOSPITAL_COMMUNITY): Payer: Self-pay

## 2020-05-10 ENCOUNTER — Emergency Department (HOSPITAL_COMMUNITY)
Admission: EM | Admit: 2020-05-10 | Discharge: 2020-05-10 | Disposition: A | Discharge status: Home / Self Care | Payer: Medicaid Other | Attending: Emergency Medicine | Admitting: Emergency Medicine

## 2020-05-10 ENCOUNTER — Other Ambulatory Visit: Payer: Self-pay

## 2020-05-10 ENCOUNTER — Emergency Department (HOSPITAL_COMMUNITY): Payer: Medicaid Other

## 2020-05-10 DIAGNOSIS — W208XXA Other cause of strike by thrown, projected or falling object, initial encounter: Secondary | ICD-10-CM | POA: Diagnosis not present

## 2020-05-10 DIAGNOSIS — Y9389 Activity, other specified: Secondary | ICD-10-CM | POA: Insufficient documentation

## 2020-05-10 DIAGNOSIS — J45909 Unspecified asthma, uncomplicated: Secondary | ICD-10-CM | POA: Insufficient documentation

## 2020-05-10 DIAGNOSIS — F17210 Nicotine dependence, cigarettes, uncomplicated: Secondary | ICD-10-CM | POA: Insufficient documentation

## 2020-05-10 DIAGNOSIS — S9031XA Contusion of right foot, initial encounter: Secondary | ICD-10-CM | POA: Diagnosis not present

## 2020-05-10 DIAGNOSIS — S99921A Unspecified injury of right foot, initial encounter: Secondary | ICD-10-CM | POA: Diagnosis present

## 2020-05-10 DIAGNOSIS — Y998 Other external cause status: Secondary | ICD-10-CM | POA: Diagnosis not present

## 2020-05-10 DIAGNOSIS — I10 Essential (primary) hypertension: Secondary | ICD-10-CM | POA: Insufficient documentation

## 2020-05-10 DIAGNOSIS — Y92810 Car as the place of occurrence of the external cause: Secondary | ICD-10-CM | POA: Insufficient documentation

## 2020-05-10 HISTORY — DX: Essential (primary) hypertension: I10

## 2020-05-10 HISTORY — DX: Unspecified asthma, uncomplicated: J45.909

## 2020-05-10 MED ORDER — OXYCODONE-ACETAMINOPHEN 5-325 MG PO TABS
1.0000 | ORAL_TABLET | Freq: Once | ORAL | Status: AC
  Administered 2020-05-10: 18:00:00 1 via ORAL
  Filled 2020-05-10: qty 1

## 2020-05-10 MED ORDER — ACETAMINOPHEN 325 MG PO TABS
650.0000 mg | ORAL_TABLET | Freq: Once | ORAL | Status: AC
  Administered 2020-05-10: 18:00:00 650 mg via ORAL
  Filled 2020-05-10: qty 2

## 2020-05-10 NOTE — ED Triage Notes (Signed)
Pt to er, pt states that she was putting a stroller in her trunk and a  Ree Kida stand pipe fell onto her R foot. Pt c/o R foot pain, states that she has tried ice and motrin without relief.

## 2020-05-10 NOTE — Discharge Instructions (Signed)
You have no broken bones in your foot.  Please rest ice and elevate your foot.  Please apply ice frequently and take Tylenol ibuprofen as described below.  Please use Tylenol or ibuprofen for pain.  You may use 600 mg ibuprofen every 6 hours or 1000 mg of Tylenol every 6 hours.  You may choose to alternate between the 2.  This would be most effective.  Not to exceed 4 g of Tylenol within 24 hours.  Not to exceed 3200 mg ibuprofen 24 hours  I have also provided you with a postop shoe which is a hard soled shoe please wear this and use crutches as needed for pain.

## 2020-05-10 NOTE — ED Provider Notes (Signed)
[facility] EMERGENCY DEPARTMENT Provider Note   CSN: [id number] Arrival date & time: 05/10/20  1722     History Chief Complaint  Patient presents with  . Foot Pain    Kaylee Short is a 29 y.o. female.  HPI      Past Medical History:  Diagnosis Date  . Asthma   . Bronchitis   . Hypertension     Patient Active Problem List   Diagnosis Date Noted  . Closed nondisplaced fracture of distal phalanx of right ring finger 02/02/2017    Past Surgical History:  Procedure Laterality Date  . CESAREAN SECTION       OB History    Gravida  3   Para  3   Term  2   Preterm  1   AB      Living  2     SAB      TAB      Ectopic      Multiple      Live Births              History reviewed. No pertinent family history.  Social History   Tobacco Use  . Smoking status: Current Every Day Smoker    Packs/day: 1.00    Types: Cigarettes  . Smokeless tobacco: Never Used  Vaping Use  . Vaping Use: Never used  Substance Use Topics  . Alcohol use: No  . Drug use: No    Home Medications Prior to Admission medications   Medication Sig Start Date End Date Taking? Authorizing Provider  albuterol (PROVENTIL HFA;VENTOLIN HFA) 108 (90 Base) MCG/ACT inhaler Inhale 1-2 puffs into the lungs every 6 (six) hours as needed for wheezing or shortness of breath. 02/19/16   Fransico Meadow, PA-C  amLODipine (NORVASC) 5 MG tablet Take 5 mg by mouth daily. 01/01/18 01/01/19  [provider]  oseltamivir (TAMIFLU) 75 MG capsule Take 1 capsule (75 mg total) by mouth every 12 (twelve) hours. 11/20/18   Lily Kocher, PA-C    Allergies    Patient has no known allergies.  Review of Systems   Review of Systems  Constitutional: Negative for fever.  HENT: Negative for congestion.   Respiratory: Negative for shortness of breath.   Cardiovascular: Negative for chest pain.  Gastrointestinal: Negative for abdominal distention.  Musculoskeletal:       Right foot pain    Neurological: Negative for dizziness and headaches.    Physical Exam Updated Vital Signs BP (!) 170/104 (BP Location: Right Arm)   Pulse 89   Temp 98.6 F (37 C) (Oral)   Resp 18   Ht 5' 7.5" (1.715 m)   Wt 89.4 kg   LMP 04/26/2020   SpO2 100%   BMI 30.40 kg/m   Physical Exam Vitals and nursing note reviewed.  Constitutional:      General: She is not in acute distress.    Appearance: Normal appearance. She is not ill-appearing.  HENT:     Head: Normocephalic and atraumatic.     Mouth/Throat:     Mouth: Mucous membranes are moist.  Eyes:     General: No scleral icterus.       Right eye: No discharge.        Left eye: No discharge.     Conjunctiva/sclera: Conjunctivae normal.  Pulmonary:     Effort: Pulmonary effort is normal.     Breath sounds: No stridor.  Musculoskeletal:     Comments: Patient able to wiggle toes;  FROM ankle  Severe tenderness palpation over the third and fourth midtarsal  Skin:    General: Skin is warm and dry.     Capillary Refill: Capillary refill takes less than 2 seconds.     Findings: Bruising present.     Comments: Mild blue bruising over the right midfoot dorsal aspect.  There is an area of bruising approximately one half dollar size  Neurological:     Mental Status: She is alert and oriented to person, place, and time. Mental status is at baseline.     Comments: Sensation intact in all toes and dorsum of foot.  Psychiatric:        Mood and Affect: Mood normal.        Behavior: Behavior normal.     ED Results / Procedures / Treatments   Labs (all labs ordered are listed, but only abnormal results are displayed) Labs Reviewed - No data to display  EKG None  Radiology DG Foot Complete Right  Result Date: 05/10/2020 CLINICAL DATA:  Ree Kida fell onto right foot. Right foot pain and swelling. Initial encounter. EXAM: RIGHT FOOT COMPLETE - 3+ VIEW COMPARISON:  None. FINDINGS: There is no evidence of fracture or dislocation. There is no  evidence of arthropathy or other focal bone abnormality. Soft tissues are unremarkable. IMPRESSION: Negative. Electronically Signed   By: Danae Orleans M.D.   On: 05/10/2020 18:07    Procedures Procedures (including critical care time)  Medications Ordered in ED Medications  oxyCODONE-acetaminophen (PERCOCET/ROXICET) 5-325 MG per tablet 1 tablet (1 tablet Oral Given 05/10/20 1804)  acetaminophen (TYLENOL) tablet 650 mg (650 mg Oral Given 05/10/20 1804)    ED Course  I have reviewed the triage vital signs and the nursing notes.  Pertinent labs & imaging results that were available during my care of the patient were reviewed by me and considered in my medical decision making (see chart for details).  Patient is a 29 year old female who dropped a approximately 6 pound piece of metal on her foot from approximately 2 feet of 1 hour prior to arrival.  She has taken nothing for pain prior to arrival.  She takes Suboxone for chronic pain that she has and states that she only took a partial dose this morning and none since.  Physical exam is notable for bruising over the right midfoot.  Concern for fracture.  Will obtain x-ray.  Clinical Course as of May 10 1948  Sun May 10, 2020  1810 X-ray of right foot independently viewed by myself.  Agree of radiology read there is no evidence of fracture.  She has tenderness to palpation over the right midfoot specifically over the right third and fourth metatarsal midshaft.  There is no evidence of cortical abnormality here.   [WF]  1810 Given 1 dose of Percocet and Tylenol for a total Tylenol dose of 950 mg.  Pain is reasonably improved at this time.  Will discharge patient with postop shoe and crutches.   [WF]    Clinical Course User Index [WF] Gailen Shelter, [place]   Patient's pain improved with pain medication.  Discharged with Tylenol and ibuprofen recommendations and postop shoe with crutches.  She remains distally neurovascularly intact with no  evidence of compartment syndrome and no evidence of fracture on x-ray.  MDM Rules/Calculators/A&P                          Final Clinical Impression(s) / ED Diagnoses Final diagnoses:  Contusion of right foot, initial encounter    Rx / DC Orders ED Discharge Orders    None       Gailen Shelter, [place] 05/10/20 1949    Vanetta Mulders, MD 05/13/20 [phone]

## 2020-05-12 MED FILL — SUBOXONE 8 MG-2 MG SL FILM: 8-2 | 28 days supply | Qty: 70 | Fill #0

## 2020-06-10 MED FILL — SUBOXONE 8 MG-2 MG SL FILM: 8-2 | 28 days supply | Qty: 70 | Fill #0

## 2020-07-08 MED FILL — SUBOXONE 8 MG-2 MG SL FILM: 8-2 | 28 days supply | Qty: 70 | Fill #0

## 2020-09-19 ENCOUNTER — Emergency Department (HOSPITAL_COMMUNITY)
Admission: EM | Admit: 2020-09-19 | Discharge: 2020-09-20 | Disposition: A | Discharge status: Home / Self Care | Payer: Medicaid Other | Attending: Emergency Medicine | Admitting: Emergency Medicine

## 2020-09-19 ENCOUNTER — Encounter (HOSPITAL_COMMUNITY): Payer: Self-pay | Admitting: Emergency Medicine

## 2020-09-19 ENCOUNTER — Other Ambulatory Visit: Payer: Self-pay

## 2020-09-19 ENCOUNTER — Emergency Department (HOSPITAL_COMMUNITY): Payer: Medicaid Other

## 2020-09-19 DIAGNOSIS — R109 Unspecified abdominal pain: Secondary | ICD-10-CM | POA: Diagnosis not present

## 2020-09-19 DIAGNOSIS — J45909 Unspecified asthma, uncomplicated: Secondary | ICD-10-CM | POA: Insufficient documentation

## 2020-09-19 DIAGNOSIS — Z79899 Other long term (current) drug therapy: Secondary | ICD-10-CM | POA: Insufficient documentation

## 2020-09-19 DIAGNOSIS — I10 Essential (primary) hypertension: Secondary | ICD-10-CM | POA: Diagnosis not present

## 2020-09-19 DIAGNOSIS — F17210 Nicotine dependence, cigarettes, uncomplicated: Secondary | ICD-10-CM | POA: Diagnosis not present

## 2020-09-19 LAB — URINALYSIS, ROUTINE W REFLEX MICROSCOPIC
Bilirubin Urine: NEGATIVE
Glucose, UA: NEGATIVE mg/dL
Hgb urine dipstick: NEGATIVE
Ketones, ur: NEGATIVE mg/dL
Leukocytes,Ua: NEGATIVE
Nitrite: NEGATIVE
Protein, ur: NEGATIVE mg/dL
Specific Gravity, Urine: 1.002 — ABNORMAL LOW (ref 1.005–1.030)
pH: 6 (ref 5.0–8.0)

## 2020-09-19 LAB — POC URINE PREG, ED: Preg Test, Ur: NEGATIVE

## 2020-09-19 MED ORDER — MORPHINE SULFATE (PF) 4 MG/ML IV SOLN
4.0000 mg | Freq: Once | INTRAVENOUS | Status: AC
  Administered 2020-09-19: 4 mg via INTRAVENOUS
  Filled 2020-09-19: qty 1

## 2020-09-19 MED ORDER — ONDANSETRON HCL 4 MG/2ML IJ SOLN
4.0000 mg | Freq: Once | INTRAMUSCULAR | Status: AC
  Administered 2020-09-19: 4 mg via INTRAVENOUS
  Filled 2020-09-19: qty 2

## 2020-09-19 MED ORDER — SODIUM CHLORIDE 0.9 % IV BOLUS
1000.0000 mL | Freq: Once | INTRAVENOUS | Status: AC
Start: 2020-09-19 — End: 2020-09-20
  Administered 2020-09-19: 1000 mL via INTRAVENOUS

## 2020-09-19 NOTE — ED Triage Notes (Signed)
Pt c/o left sided flank pain that started tonight.

## 2020-09-20 LAB — CBC WITH DIFFERENTIAL/PLATELET
Abs Immature Granulocytes: 0.01 10*3/uL (ref 0.00–0.07)
Basophils Absolute: 0.1 10*3/uL (ref 0.0–0.1)
Basophils Relative: 2 %
Eosinophils Absolute: 0.2 10*3/uL (ref 0.0–0.5)
Eosinophils Relative: 5 %
HCT: 39.1 % (ref 36.0–46.0)
Hemoglobin: 13.1 g/dL (ref 12.0–15.0)
Immature Granulocytes: 0 %
Lymphocytes Relative: 31 %
Lymphs Abs: 1.3 10*3/uL (ref 0.7–4.0)
MCH: 29.7 pg (ref 26.0–34.0)
MCHC: 33.5 g/dL (ref 30.0–36.0)
MCV: 88.7 fL (ref 80.0–100.0)
Monocytes Absolute: 0.3 10*3/uL (ref 0.1–1.0)
Monocytes Relative: 7 %
Neutro Abs: 2.2 10*3/uL (ref 1.7–7.7)
Neutrophils Relative %: 55 %
Platelets: 161 10*3/uL (ref 150–400)
RBC: 4.41 MIL/uL (ref 3.87–5.11)
RDW: 11.4 % — ABNORMAL LOW (ref 11.5–15.5)
WBC: 4.1 10*3/uL (ref 4.0–10.5)
nRBC: 0 % (ref 0.0–0.2)

## 2020-09-20 LAB — BASIC METABOLIC PANEL
Anion gap: 8 (ref 5–15)
BUN: 9 mg/dL (ref 6–20)
CO2: 27 mmol/L (ref 22–32)
Calcium: 8.8 mg/dL — ABNORMAL LOW (ref 8.9–10.3)
Chloride: 104 mmol/L (ref 98–111)
Creatinine, Ser: 0.56 mg/dL (ref 0.44–1.00)
GFR, Estimated: >60 mL/min (ref >60)
Glucose, Bld: 92 mg/dL (ref 70–99)
Potassium: 3.6 mmol/L (ref 3.5–5.1)
Sodium: 139 mmol/L (ref 135–145)

## 2020-09-20 MED ORDER — KETOROLAC TROMETHAMINE 30 MG/ML IJ SOLN
15.0000 mg | Freq: Once | INTRAMUSCULAR | Status: AC
  Administered 2020-09-20: 15 mg via INTRAVENOUS
  Filled 2020-09-20: qty 1

## 2020-09-20 MED ORDER — NAPROXEN 500 MG PO TABS: 500.0000 mg | ORAL_TABLET | Freq: Two times a day (BID) | ORAL | 0 refills | Status: DC

## 2020-09-20 NOTE — Discharge Instructions (Signed)
You were seen today for left pack flank pain.  Your CT scan has some findings suggestive that you may have recently passed a stone.  You can have some ongoing ureteral spasm.  Take naproxen twice daily for the next several days.

## 2020-09-20 NOTE — ED Provider Notes (Signed)
[facility] EMERGENCY DEPARTMENT Provider Note   CSN: [id number] Arrival date & time: 09/19/20  2143     History Chief Complaint  Patient presents with  . Flank Pain    Kaylee Short is a 29 y.o. female.  HPI     This is a 29 year old female with a history of asthma and hypertension who presents with left flank pain. Patient reports onset of symptoms abruptly yesterday. She states that it progressively gotten more intense. Pain is intermittent and comes and goes. It occasionally radiates into the left lower quadrant and she just had a sharp pain in the left lower quadrant. She currently rates her pain at 7 out of 10. She took ibuprofen several hours ago with minimal relief. No nausea or vomiting. She has not noted any dysuria hematuria but has noted some frequency. Does not believe herself to be pregnant. Reports tubal ligation.  Past Medical History:  Diagnosis Date  . Asthma   . Bronchitis   . Hypertension     Patient Active Problem List   Diagnosis Date Noted  . Closed nondisplaced fracture of distal phalanx of right ring finger 02/02/2017    Past Surgical History:  Procedure Laterality Date  . CESAREAN SECTION       OB History    Gravida  3   Para  3   Term  2   Preterm  1   AB      Living  2     SAB      TAB      Ectopic      Multiple      Live Births              No family history on file.  Social History   Tobacco Use  . Smoking status: Current Every Day Smoker    Packs/day: 1.00    Types: Cigarettes  . Smokeless tobacco: Never Used  Vaping Use  . Vaping Use: Never used  Substance Use Topics  . Alcohol use: No  . Drug use: No    Home Medications Prior to Admission medications   Medication Sig Start Date End Date Taking? Authorizing Provider  albuterol (PROVENTIL HFA;VENTOLIN HFA) 108 (90 Base) MCG/ACT inhaler Inhale 1-2 puffs into the lungs every 6 (six) hours as needed for wheezing or shortness of breath. 02/19/16    Elson Areas, PA-C  amLODipine (NORVASC) 5 MG tablet Take 5 mg by mouth daily. 01/01/18 01/01/19  [provider]  naproxen (NAPROSYN) 500 MG tablet Take 1 tablet (500 mg total) by mouth 2 (two) times daily. 09/20/20   Patrcia Schnepp, Mayer Masker, MD  oseltamivir (TAMIFLU) 75 MG capsule Take 1 capsule (75 mg total) by mouth every 12 (twelve) hours. 11/20/18   Ivery Quale, PA-C    Allergies    Patient has no known allergies.  Review of Systems   Review of Systems  Constitutional: Negative for fever.  Respiratory: Negative for shortness of breath.   Cardiovascular: Negative for chest pain.  Gastrointestinal: Negative for abdominal pain, nausea and vomiting.  Genitourinary: Positive for flank pain and frequency. Negative for dysuria and hematuria.  All other systems reviewed and are negative.   Physical Exam Updated Vital Signs BP 132/84   Pulse 75   Temp 98.1 F (36.7 C)   Resp 18   Ht 1.715 m (5' 7.5")   Wt 90.7 kg   SpO2 100%   BMI 30.86 kg/m   Physical Exam Vitals and nursing note reviewed.  Constitutional:      Appearance: She is well-developed. She is obese. She is not ill-appearing.  HENT:     Head: Normocephalic and atraumatic.     Nose: Nose normal.     Mouth/Throat:     Mouth: Mucous membranes are moist.  Eyes:     Pupils: Pupils are equal, round, and reactive to light.  Cardiovascular:     Rate and Rhythm: Normal rate and regular rhythm.     Heart sounds: Normal heart sounds.  Pulmonary:     Effort: Pulmonary effort is normal. No respiratory distress.     Breath sounds: No wheezing.  Abdominal:     General: Bowel sounds are normal.     Palpations: Abdomen is soft.     Tenderness: There is no right CVA tenderness or left CVA tenderness.  Musculoskeletal:     Cervical back: Neck supple.     Right lower leg: No edema.     Left lower leg: No edema.  Skin:    General: Skin is warm and dry.  Neurological:     Mental Status: She is alert and oriented to  person, place, and time.  Psychiatric:        Mood and Affect: Mood normal.     ED Results / Procedures / Treatments   Labs (all labs ordered are listed, but only abnormal results are displayed) Labs Reviewed  URINALYSIS, ROUTINE W REFLEX MICROSCOPIC - Abnormal; Notable for the following components:      Result Value   Color, Urine STRAW (*)    Specific Gravity, Urine 1.002 (*)    All other components within normal limits  CBC WITH DIFFERENTIAL/PLATELET - Abnormal; Notable for the following components:   RDW 11.4 (*)    All other components within normal limits  BASIC METABOLIC PANEL  POC URINE PREG, ED    EKG None  Radiology CT Renal Stone Study  Result Date: 09/20/2020 CLINICAL DATA:  Sharp constant left flank pain which began tonight, stone disease suspected EXAM: CT ABDOMEN AND PELVIS WITHOUT CONTRAST TECHNIQUE: Multidetector CT imaging of the abdomen and pelvis was performed following the standard protocol without IV contrast. COMPARISON:  CT 02/03/2018 FINDINGS: Lower chest: Lung bases are clear. Normal heart size. No pericardial effusion. Hepatobiliary: Small amount of focal fatty infiltration seen along the falciform ligament. No concerning focal liver lesion within the limitations of an unenhanced CT. Normal gallbladder and biliary tree without visible calcified gallstone, pericholecystic inflammation or ductal dilatation. Pancreas: No pancreatic ductal dilatation or surrounding inflammatory changes. Spleen: Splenomegaly, similar to comparison. No focal concerning lesion. Adrenals/Urinary Tract: Normal adrenal glands. Kidneys are symmetric in size and normally located. No significant perinephric or periureteral stranding. Slight asymmetric left urinary tract dilatation. No demonstrable urolith is seen however. Multiple calcifications in the vicinity of the posterior bladder are unchanged from prior and likely reflect phleboliths. No right urinary tract dilatation. Urinary bladder  is unremarkable for degree of distension. No visible bladder calculi or debris. Stomach/Bowel: Small sliding-type hiatal hernia. Distal stomach and duodenum are unremarkable. No small bowel thickening or dilatation. No colonic dilatation or wall thickening. Scattered colonic diverticula without focal inflammation to suggest diverticulitis. A normal appendix is visualized. No evidence of obstruction Vascular/Lymphatic: No significant vascular findings are present. No enlarged abdominal or pelvic lymph nodes. Reproductive: Normal anteverted uterus. 2.9 cm cystic lesion arising in the right adnexa is likely a benign functional follicle. No follow-up imaging recommended. Note: This recommendation does not apply to premenarchal patients and  to those with increased risk (genetic, family history, elevated tumor markers or other high-risk factors) of ovarian cancer. Reference: JACR 2020 Feb; 17(2):248-254. No concerning adnexal lesions. Other: No abdominopelvic free fluid or free gas. No bowel containing hernias. Tiny fat containing umbilical hernia. Metallic navel piercing. Musculoskeletal: No acute osseous abnormality or suspicious osseous lesion. IMPRESSION: 1. Slight asymmetric left urinary tract dilatation without demonstrable urolith. Findings could reflect recently passed stone or urinary tract infection. Correlate with patient's symptoms. 2. Splenomegaly, similar to comparison. 3. Small sliding-type hiatal hernia. 4. Diverticulosis without evidence of acute diverticulitis. 5. 2.9 cm cystic lesion arising in the right adnexa is likely a benign functional follicle. No follow-up imaging recommended. Electronically Signed   By: Kreg Shropshire M.D.   On: 09/20/2020 00:09    Procedures Procedures (including critical care time)  Medications Ordered in ED Medications  ketorolac (TORADOL) 30 MG/ML injection 15 mg (has no administration in time range)  morphine 4 MG/ML injection 4 mg (4 mg Intravenous Given 09/19/20  2336)  ondansetron (ZOFRAN) injection 4 mg (4 mg Intravenous Given 09/19/20 2336)  sodium chloride 0.9 % bolus 1,000 mL (0 mLs Intravenous Stopped 09/20/20 0153)    ED Course  I have reviewed the triage vital signs and the nursing notes.  Pertinent labs & imaging results that were available during my care of the patient were reviewed by me and considered in my medical decision making (see chart for details).    MDM Rules/Calculators/A&P                           Patient presents with left flank pain.  She is overall nontoxic and vital signs are reassuring.  She has no reproducible tenderness on exam.  Considerations include but not limited to, UTI, kidney stone, less likely diverticulitis or ovarian pathology.  Patient given pain and nausea medication as well as fluids.  CT stone study obtained as she has no history of kidney stones.  Lab work also obtained.  No significant leukocytosis.  No significant metabolic derangements.  Urinalysis without evidence of UTI.  CT scan shows slight asymmetry of the left urinary tract without noted stone.  This could indicate a recently passed stone.  Otherwise she has several other incidental findings likely not related to her current symptoms.  On recheck, she still has some residual pain but is largely improved.  Will give a dose of Toradol.  Suspect she may have some persistent ureteral spasm from her recently passed stone as this best fits her clinical picture.  After history, exam, and medical workup I feel the patient has been appropriately medically screened and is safe for discharge home. Pertinent diagnoses were discussed with the patient. Patient was given return precautions.   Final Clinical Impression(s) / ED Diagnoses Final diagnoses:  Left flank pain    Rx / DC Orders ED Discharge Orders         Ordered    naproxen (NAPROSYN) 500 MG tablet  2 times daily        09/20/20 0215           Alysandra Lobue, Mayer Masker, MD 09/20/20 [phone]

## 2020-12-02 ENCOUNTER — Other Ambulatory Visit (HOSPITAL_COMMUNITY): Payer: Self-pay | Admitting: Physician Assistant

## 2020-12-02 MED FILL — SUBOXONE 8 MG-2 MG SL FILM: 8-2 | 28 days supply | Qty: 70 | Fill #0

## 2020-12-30 ENCOUNTER — Other Ambulatory Visit (HOSPITAL_COMMUNITY): Payer: Self-pay | Admitting: Physician Assistant

## 2020-12-30 MED FILL — SUBOXONE 8 MG-2 MG SL FILM: 8-2 | 28 days supply | Qty: 70 | Fill #0

## 2021-01-27 ENCOUNTER — Other Ambulatory Visit (HOSPITAL_COMMUNITY): Payer: Self-pay | Admitting: Physician Assistant

## 2021-02-24 ENCOUNTER — Other Ambulatory Visit (HOSPITAL_COMMUNITY): Payer: Self-pay

## 2021-02-24 MED ORDER — SUBOXONE 8-2 MG SL FILM
ORAL_FILM | SUBLINGUAL | 0 refills | Status: DC
  Filled 2021-02-24: qty 70, 28d supply, fill #0

## 2021-03-21 DIAGNOSIS — I10 Essential (primary) hypertension: Secondary | ICD-10-CM | POA: Insufficient documentation

## 2021-03-21 DIAGNOSIS — G5600 Carpal tunnel syndrome, unspecified upper limb: Secondary | ICD-10-CM | POA: Insufficient documentation

## 2021-03-21 DIAGNOSIS — R209 Unspecified disturbances of skin sensation: Secondary | ICD-10-CM | POA: Insufficient documentation

## 2021-03-24 ENCOUNTER — Other Ambulatory Visit (HOSPITAL_COMMUNITY): Payer: Self-pay

## 2021-03-24 MED ORDER — SUBOXONE 8-2 MG SL FILM
ORAL_FILM | SUBLINGUAL | 0 refills | Status: DC
  Filled 2021-03-24: qty 70, 28d supply, fill #0

## 2021-03-26 ENCOUNTER — Emergency Department (HOSPITAL_COMMUNITY)
Admission: EM | Admit: 2021-03-26 | Discharge: 2021-03-26 | Disposition: A | Discharge status: Home / Self Care | Payer: Medicaid Other | Attending: Emergency Medicine | Admitting: Emergency Medicine

## 2021-03-26 ENCOUNTER — Other Ambulatory Visit: Payer: Self-pay

## 2021-03-26 ENCOUNTER — Encounter (HOSPITAL_COMMUNITY): Payer: Self-pay | Admitting: *Deleted

## 2021-03-26 DIAGNOSIS — Z79899 Other long term (current) drug therapy: Secondary | ICD-10-CM | POA: Diagnosis not present

## 2021-03-26 DIAGNOSIS — J45909 Unspecified asthma, uncomplicated: Secondary | ICD-10-CM | POA: Diagnosis not present

## 2021-03-26 DIAGNOSIS — M542 Cervicalgia: Secondary | ICD-10-CM | POA: Diagnosis present

## 2021-03-26 DIAGNOSIS — M62838 Other muscle spasm: Secondary | ICD-10-CM

## 2021-03-26 DIAGNOSIS — I10 Essential (primary) hypertension: Secondary | ICD-10-CM | POA: Insufficient documentation

## 2021-03-26 DIAGNOSIS — F17210 Nicotine dependence, cigarettes, uncomplicated: Secondary | ICD-10-CM | POA: Insufficient documentation

## 2021-03-26 LAB — CBC WITH DIFFERENTIAL/PLATELET
Abs Immature Granulocytes: 0.02 10*3/uL (ref 0.00–0.07)
Basophils Absolute: 0.1 10*3/uL (ref 0.0–0.1)
Basophils Relative: 1 %
Eosinophils Absolute: 0.3 10*3/uL (ref 0.0–0.5)
Eosinophils Relative: 5 %
HCT: 45.3 % (ref 36.0–46.0)
Hemoglobin: 14.8 g/dL (ref 12.0–15.0)
Immature Granulocytes: 0 %
Lymphocytes Relative: 23 %
Lymphs Abs: 1.4 10*3/uL (ref 0.7–4.0)
MCH: 29.4 pg (ref 26.0–34.0)
MCHC: 32.7 g/dL (ref 30.0–36.0)
MCV: 90.1 fL (ref 80.0–100.0)
Monocytes Absolute: 0.4 10*3/uL (ref 0.1–1.0)
Monocytes Relative: 6 %
Neutro Abs: 3.8 10*3/uL (ref 1.7–7.7)
Neutrophils Relative %: 65 %
Platelets: 179 10*3/uL (ref 150–400)
RBC: 5.03 MIL/uL (ref 3.87–5.11)
RDW: 11.9 % (ref 11.5–15.5)
WBC: 6 10*3/uL (ref 4.0–10.5)
nRBC: 0 % (ref 0.0–0.2)

## 2021-03-26 LAB — BASIC METABOLIC PANEL
Anion gap: 7 (ref 5–15)
BUN: 10 mg/dL (ref 6–20)
CO2: 25 mmol/L (ref 22–32)
Calcium: 9.3 mg/dL (ref 8.9–10.3)
Chloride: 104 mmol/L (ref 98–111)
Creatinine, Ser: 0.75 mg/dL (ref 0.44–1.00)
GFR, Estimated: >60 mL/min (ref >60)
Glucose, Bld: 99 mg/dL (ref 70–99)
Potassium: 4 mmol/L (ref 3.5–5.1)
Sodium: 136 mmol/L (ref 135–145)

## 2021-03-26 MED ORDER — IBUPROFEN 600 MG PO TABS: 600.0000 mg | ORAL_TABLET | Freq: Four times a day (QID) | ORAL | 0 refills | Status: DC | PRN

## 2021-03-26 MED ORDER — CYCLOBENZAPRINE HCL 10 MG PO TABS: 10.0000 mg | ORAL_TABLET | Freq: Two times a day (BID) | ORAL | 0 refills | Status: DC | PRN

## 2021-03-26 MED ORDER — CYCLOBENZAPRINE HCL 10 MG PO TABS
10.0000 mg | ORAL_TABLET | Freq: Once | ORAL | Status: AC
  Administered 2021-03-26: 10 mg via ORAL
  Filled 2021-03-26: qty 1

## 2021-03-26 MED ORDER — KETOROLAC TROMETHAMINE 60 MG/2ML IM SOLN
60.0000 mg | Freq: Once | INTRAMUSCULAR | Status: AC
  Administered 2021-03-26: 60 mg via INTRAMUSCULAR
  Filled 2021-03-26: qty 2

## 2021-03-26 NOTE — Discharge Instructions (Addendum)
Your work-up today was reassuring.  Please take the medications, as directed.  Continue with the heating pads as needed.  You were given a prescription for Flexeril which is a muscle relaxer.  You should not drive, work, consume alcohol, or operate machinery while taking this medication as it can make you very drowsy.   You were given sedative medications while in the emergency department. Do not drive. Do not use machinery or power tools. Do not sign legal documents. Do not drink alcohol. Do not take sleeping pills. Do not supervise children by yourself. Do not participate in activities that require climbing or being in high places.  Please follow-up with your primary care provider regarding today's ED encounter and for ongoing evaluation and management.  You may also follow-up with your neurologist.  Return to the ED or seek immediate medical attention if you develop any worsening pain symptoms, fevers or chills, worsening neck stiffness, visual changes or other neurologic deficits, or any other new or worsening symptoms

## 2021-03-26 NOTE — ED Provider Notes (Addendum)
[facility] EMERGENCY DEPARTMENT Provider Note   CSN: [id number] Arrival date & time: 03/26/21  1622     History Chief Complaint  Patient presents with  . Neck Pain    Kaylee Short is a 30 y.o. female who presents the ED with complaints of atraumatic neck pain.  I reviewed patient's medical record and she is followed by [facility] and has nerve conduction studies scheduled for evaluation of carpal tunnel and her sciatica.  She states that she has a 6-year history of chronic back pain for which she take Suboxone, prescribed to her by a chronic pain and substance use program in [place], [place].  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  From she was also recently diagnosed with endometriosis and is followed with OB/GYN.  She states that she woke up at 3 AM with urge to defecate.  After going to the bathroom, she stood up and had mild ringing or ears bilaterally and felt lightheaded as though she may pass out.  She states this is not the first time that this happened and it will often happen when standing from seated position.  After resting for 5 seconds to allow for the symptoms to pass, she then developed midline posterior neck pain that radiated into occipital scalp.  She states that it has been constant, persistent.  There is no obvious movement or trauma to precipitate her pain symptoms.  She has been treating it with ibuprofen which she last took this morning as well as heating pads.  Her pain is worse when turning her head.  She came here given failure of improvement.  She denies any IVDA, fevers or chills, room spinning dizziness, blurred vision, hearing changes, difficulty swallowing, chest pain or shortness of breath, recent illness or infection, rash, numbness or weakness of extremities, ataxia or other gait disturbance, or other  symptoms.  HPI     Past Medical History:  Diagnosis Date  . Asthma   . Bronchitis   . Hypertension     Patient Active Problem List   Diagnosis Date Noted  . Closed nondisplaced fracture of distal phalanx of right ring finger 02/02/2017    Past Surgical History:  Procedure Laterality Date  . CESAREAN SECTION       OB History    Gravida  3   Para  3   Term  2   Preterm  1   AB      Living  2     SAB      IAB      Ectopic      Multiple      Live Births              No family history on file.  Social History   Tobacco Use  . Smoking status: Current Every Day Smoker    Packs/day: 1.00    Types: Cigarettes  . Smokeless tobacco: Never Used  Vaping Use  . Vaping Use: Never used  Substance Use Topics  . Alcohol use: No  . Drug use: No    Home Medications Prior to Admission medications   Medication Sig Start Date End Date Taking? Authorizing Provider  cyclobenzaprine (FLEXERIL) 10 MG tablet Take 1 tablet (10 mg total) by mouth 2 (two) times daily as needed for muscle spasms. 03/26/21  Yes Lorelee New, PA-C  ibuprofen (ADVIL) 600  MG tablet Take 1 tablet (600 mg total) by mouth every 6 (six) hours as needed. 03/26/21  Yes Lorelee New, PA-C  albuterol (PROVENTIL HFA;VENTOLIN HFA) 108 (90 Base) MCG/ACT inhaler Inhale 1-2 puffs into the lungs every 6 (six) hours as needed for wheezing or shortness of breath. 02/19/16   Elson Areas, PA-C  amLODipine (NORVASC) 5 MG tablet Take 5 mg by mouth daily. 01/01/18 01/01/19  [provider]  naproxen (NAPROSYN) 500 MG tablet Take 1 tablet (500 mg total) by mouth 2 (two) times daily. 09/20/20   Horton, Mayer Masker, MD  oseltamivir (TAMIFLU) 75 MG capsule Take 1 capsule (75 mg total) by mouth every 12 (twelve) hours. 11/20/18   Ivery Quale, PA-C  SUBOXONE 8-2 MG FILM DISSOLVE 1 FILM UNDER TONGUE TWICE DAILY AND 1/2 FILM UNDER TONGUE BEFORE BEDTIME. 01/27/21 07/26/21  Treasa School, PA-C  SUBOXONE 8-2 MG  FILM PLACE 1 FILM UNDER TONGUE TWICE DAILY AND 1/2 FILM UNDER TONGUE BEFORE BEDTIME. 12/30/20 06/28/21  Land, Aneta Mins, PA-C  SUBOXONE 8-2 MG FILM PLACE 1 FILM UNDER THE TONGUE 2 TIMES DAILY AND 1/2 FILM UNDER THE TONGUE BEFORE BEDTIME 12/02/20 05/31/21  Land, Aneta Mins, PA-C  SUBOXONE 8-2 MG FILM Place 1 film under tongue twice daily to dissolve and 1/2 film under tongue before bedtime. 03/24/21       Allergies    Patient has no known allergies.  Review of Systems   Review of Systems  All other systems reviewed and are negative.   Physical Exam Updated Vital Signs BP (!) 145/97   Pulse 77   Temp 98.3 F (36.8 C)   Resp 17   SpO2 100%   Physical Exam Vitals and nursing note reviewed. Exam conducted with a chaperone present.  Constitutional:      General: She is not in acute distress.    Appearance: She is not toxic-appearing.     Comments: Tearful.  HENT:     Head: Normocephalic and atraumatic.     Mouth/Throat:     Pharynx: Oropharynx is clear.  Eyes:     General: No scleral icterus.    Extraocular Movements: Extraocular movements intact.     Conjunctiva/sclera: Conjunctivae normal.     Pupils: Pupils are equal, round, and reactive to light.  Neck:     Comments: TTP predominantly over paraspinous muscles, but also over midline.  No swelling or overlying skin changes. Cardiovascular:     Rate and Rhythm: Normal rate.     Pulses: Normal pulses.  Pulmonary:     Effort: Pulmonary effort is normal. No respiratory distress.  Musculoskeletal:        General: Normal range of motion.     Cervical back: Rigidity and tenderness present.  Skin:    General: Skin is dry.  Neurological:     General: No focal deficit present.     Mental Status: She is alert and oriented to person, place, and time.     GCS: GCS eye subscore is 4. GCS verbal subscore is 5. GCS motor subscore is 6.     Cranial Nerves: No cranial nerve deficit.     Sensory: No sensory deficit.     Motor: No weakness.      Coordination: Coordination normal.     Gait: Gait normal.     Comments: CN II through XII grossly intact.  PERRL and EOM intact.  No nystagmus.  Normal neuro exam.  Psychiatric:        Mood and Affect:  Mood normal.        Behavior: Behavior normal.        Thought Content: Thought content normal.     ED Results / Procedures / Treatments   Labs (all labs ordered are listed, but only abnormal results are displayed) Labs Reviewed  CBC WITH DIFFERENTIAL/PLATELET  BASIC METABOLIC PANEL    EKG None  Radiology No results found.  Procedures Procedures   Medications Ordered in ED Medications  ketorolac (TORADOL) injection 60 mg (60 mg Intramuscular Given 03/26/21 1856)  cyclobenzaprine (FLEXERIL) tablet 10 mg (10 mg Oral Given 03/26/21 1856)    ED Course  I have reviewed the triage vital signs and the nursing notes.  Pertinent labs & imaging results that were available during my care of the patient were reviewed by me and considered in my medical decision making (see chart for details).    MDM Rules/Calculators/A&P                          Kaylee Short was evaluated in Emergency Department on 03/26/2021 for the symptoms described in the history of present illness. She was evaluated in the context of the global COVID-19 pandemic, which necessitated consideration that the patient might be at risk for infection with the SARS-CoV-2 virus that causes COVID-19. Institutional protocols and algorithms that pertain to the evaluation of patients at risk for COVID-19 are in a state of rapid change based on information released by regulatory bodies including the CDC and federal and state organizations. These policies and algorithms were followed during the patient's care in the ED.  I personally reviewed patient's medical chart and all notes from triage and staff during today's encounter. I have also ordered and reviewed all labs and imaging that I felt to be medically necessary in the  evaluation of this patient's complaints and with consideration of their physical exam. If needed, translation services were available and utilized.   Patient with atraumatic neck pain that occurred shortly after waking up from her sleep.  Suspect musculoskeletal strain.  There is no precipitating trauma and I have lower suspicion for vertebral dissection.  She denies any IVDA or other illicit drug use and I have lower suspicion for discitis or osteomyelitis.  Her tenderness was predominantly over paraspinous muscles and she feels improved after treatment here with Toradol and Flexeril.  She would like to go home with continued NSAIDs, muscle relaxants, and heating pads as needed.  I discussed possible lumbar puncture, but patient declined at this time.  I feel as though that is reasonable as this is seemingly more musculoskeletal and she is afebrile/denies fevers at home.  No obvious sick contacts your positive for meningitis.  Given patient's entirely normal neuro exam and given that she is not ill-appearing, feel as though conservative management is reasonable.  Laboratory work-up is without any derangement.  However, discussed very strict ED return precautions and she voiced understanding and is agreeable.  She knows that she will need to return to the ER if she develops any worsening in her pain symptoms, fevers or chills, worsening meningismus, visual changes or other neurologic deficits, or any other new or worsening symptoms.  Otherwise, she can follow-up with her primary care provider and neurologist.  Discussed case with Dr. Rhunette Croft who agrees with assessment and plan.  I also cautioned on the addictive potential of muscle relaxants given her history of addiction.  She will proceed with caution.  She is getting a ride  home.  Final Clinical Impression(s) / ED Diagnoses Final diagnoses:  Muscle spasms of neck    Rx / DC Orders ED Discharge Orders         Ordered    cyclobenzaprine (FLEXERIL)  10 MG tablet  2 times daily PRN        03/26/21 2018    ibuprofen (ADVIL) 600 MG tablet  Every 6 hours PRN        03/26/21 2018           Lorelee NewGreen, Bonnetta Allbee L, PA-C 03/26/21 2018    Lorelee NewGreen, Jaycie Kregel L, PA-C 03/26/21 2020    Derwood KaplanNanavati, Ankit, MD 04/02/21 1517

## 2021-03-26 NOTE — ED Provider Notes (Signed)
Emergency Medicine Provider Triage Evaluation Note  Kaylee Short , a 30 y.o. female  was evaluated in triage.  Pt complains of neck. Started this morning around 3 am when she went to use the bathroom. No trauma to the neck. States the pain has bee constant. Worsened by movement. No previous neck or back surgeries. No IV drug use. No fevers or chills. No saddle anesthesia or urinary incontinence. No vision changes  Review of Systems  Positive: Neck pain Negative: Vision changes, dysuria, back pain  Physical Exam  BP (!) 156/100 (BP Location: Right Arm)   Pulse 87   Temp 98.4 F (36.9 C) (Oral)   Resp 18   SpO2 99%  Gen:   Awake, no distress. Patient tearful Resp:  Normal effort  MSK:   Moves extremities without difficulty. Decreased ROM to neck. Limited flexion, limited rotation. Mildly TTP. No palpable mass. Tattoo present  Other:    Medical Decision Making  Medically screening exam initiated at 5:02 PM.  Appropriate orders placed.  Kaylee Short was informed that the remainder of the evaluation will be completed by another provider, this initial triage assessment does not replace that evaluation, and the importance of remaining in the ED until their evaluation is complete.    Theron Arista, PA-C 03/26/21 1704    Derwood Kaplan, MD 03/30/21 1441

## 2021-03-26 NOTE — ED Triage Notes (Signed)
C/o pain in neck onset last night, states pain is severe

## 2021-04-21 ENCOUNTER — Other Ambulatory Visit (HOSPITAL_COMMUNITY): Payer: Self-pay

## 2021-04-21 MED ORDER — SUBOXONE 8-2 MG SL FILM
ORAL_FILM | SUBLINGUAL | 0 refills | Status: DC
  Filled 2021-04-21: qty 70, 28d supply, fill #0

## 2021-04-23 ENCOUNTER — Emergency Department (HOSPITAL_COMMUNITY): Payer: Medicaid Other

## 2021-04-23 ENCOUNTER — Telehealth (HOSPITAL_COMMUNITY): Payer: Self-pay | Admitting: Medical

## 2021-04-23 ENCOUNTER — Other Ambulatory Visit: Payer: Self-pay

## 2021-04-23 ENCOUNTER — Ambulatory Visit (HOSPITAL_COMMUNITY)
Admission: RE | Admit: 2021-04-23 | Discharge: 2021-04-23 | Disposition: A | Discharge status: Home / Self Care | Payer: Medicaid Other | Source: Ambulatory Visit | Attending: Emergency Medicine | Admitting: Emergency Medicine

## 2021-04-23 ENCOUNTER — Emergency Department (HOSPITAL_COMMUNITY)
Admission: EM | Admit: 2021-04-23 | Discharge: 2021-04-23 | Disposition: A | Discharge status: Home / Self Care | Payer: Medicaid Other | Attending: Emergency Medicine | Admitting: Emergency Medicine

## 2021-04-23 ENCOUNTER — Encounter (HOSPITAL_COMMUNITY): Payer: Self-pay | Admitting: Emergency Medicine

## 2021-04-23 DIAGNOSIS — J45909 Unspecified asthma, uncomplicated: Secondary | ICD-10-CM | POA: Insufficient documentation

## 2021-04-23 DIAGNOSIS — F17210 Nicotine dependence, cigarettes, uncomplicated: Secondary | ICD-10-CM | POA: Insufficient documentation

## 2021-04-23 DIAGNOSIS — R932 Abnormal findings on diagnostic imaging of liver and biliary tract: Secondary | ICD-10-CM | POA: Insufficient documentation

## 2021-04-23 DIAGNOSIS — W57XXXA Bitten or stung by nonvenomous insect and other nonvenomous arthropods, initial encounter: Secondary | ICD-10-CM | POA: Insufficient documentation

## 2021-04-23 DIAGNOSIS — S30861A Insect bite (nonvenomous) of abdominal wall, initial encounter: Secondary | ICD-10-CM | POA: Diagnosis not present

## 2021-04-23 DIAGNOSIS — R110 Nausea: Secondary | ICD-10-CM | POA: Diagnosis not present

## 2021-04-23 DIAGNOSIS — Z79899 Other long term (current) drug therapy: Secondary | ICD-10-CM | POA: Insufficient documentation

## 2021-04-23 DIAGNOSIS — I10 Essential (primary) hypertension: Secondary | ICD-10-CM | POA: Insufficient documentation

## 2021-04-23 DIAGNOSIS — R109 Unspecified abdominal pain: Secondary | ICD-10-CM | POA: Diagnosis not present

## 2021-04-23 LAB — HEPATIC FUNCTION PANEL
ALT: 14 U/L (ref 0–44)
AST: 15 U/L (ref 15–41)
Albumin: 4.1 g/dL (ref 3.5–5.0)
Alkaline Phosphatase: 45 U/L (ref 38–126)
Bilirubin, Direct: 0.1 mg/dL (ref 0.0–0.2)
Indirect Bilirubin: 0.5 mg/dL (ref 0.3–0.9)
Total Bilirubin: 0.6 mg/dL (ref 0.3–1.2)
Total Protein: 6.7 g/dL (ref 6.5–8.1)

## 2021-04-23 LAB — BASIC METABOLIC PANEL
Anion gap: 6 (ref 5–15)
BUN: 11 mg/dL (ref 6–20)
CO2: 26 mmol/L (ref 22–32)
Calcium: 9 mg/dL (ref 8.9–10.3)
Chloride: 104 mmol/L (ref 98–111)
Creatinine, Ser: 0.75 mg/dL (ref 0.44–1.00)
GFR, Estimated: >60 mL/min (ref >60)
Glucose, Bld: 100 mg/dL — ABNORMAL HIGH (ref 70–99)
Potassium: 4.2 mmol/L (ref 3.5–5.1)
Sodium: 136 mmol/L (ref 135–145)

## 2021-04-23 LAB — PREGNANCY, URINE: Preg Test, Ur: NEGATIVE

## 2021-04-23 LAB — CBC WITH DIFFERENTIAL/PLATELET
Abs Immature Granulocytes: 0.01 10*3/uL (ref 0.00–0.07)
Basophils Absolute: 0.1 10*3/uL (ref 0.0–0.1)
Basophils Relative: 2 %
Eosinophils Absolute: 0.2 10*3/uL (ref 0.0–0.5)
Eosinophils Relative: 4 %
HCT: 40.3 % (ref 36.0–46.0)
Hemoglobin: 13.4 g/dL (ref 12.0–15.0)
Immature Granulocytes: 0 %
Lymphocytes Relative: 33 %
Lymphs Abs: 1.4 10*3/uL (ref 0.7–4.0)
MCH: 29.5 pg (ref 26.0–34.0)
MCHC: 33.3 g/dL (ref 30.0–36.0)
MCV: 88.6 fL (ref 80.0–100.0)
Monocytes Absolute: 0.4 10*3/uL (ref 0.1–1.0)
Monocytes Relative: 9 %
Neutro Abs: 2.3 10*3/uL (ref 1.7–7.7)
Neutrophils Relative %: 52 %
Platelets: 158 10*3/uL (ref 150–400)
RBC: 4.55 MIL/uL (ref 3.87–5.11)
RDW: 11.7 % (ref 11.5–15.5)
WBC: 4.3 10*3/uL (ref 4.0–10.5)
nRBC: 0 % (ref 0.0–0.2)

## 2021-04-23 LAB — URINALYSIS, ROUTINE W REFLEX MICROSCOPIC
Bilirubin Urine: NEGATIVE
Glucose, UA: NEGATIVE mg/dL
Hgb urine dipstick: NEGATIVE
Ketones, ur: NEGATIVE mg/dL
Leukocytes,Ua: NEGATIVE
Nitrite: NEGATIVE
Protein, ur: NEGATIVE mg/dL
Specific Gravity, Urine: 1.009 (ref 1.005–1.030)
pH: 7 (ref 5.0–8.0)

## 2021-04-23 LAB — LIPASE, BLOOD: Lipase: 27 U/L (ref 11–51)

## 2021-04-23 MED ORDER — CEPHALEXIN 500 MG PO CAPS: 500.0000 mg | ORAL_CAPSULE | Freq: Two times a day (BID) | ORAL | 0 refills | Status: AC

## 2021-04-23 MED ORDER — IBUPROFEN 400 MG PO TABS
400.0000 mg | ORAL_TABLET | Freq: Once | ORAL | Status: AC
  Administered 2021-04-23: 400 mg via ORAL
  Filled 2021-04-23: qty 1

## 2021-04-23 MED ORDER — DOXYCYCLINE HYCLATE 100 MG PO CAPS: 100.0000 mg | ORAL_CAPSULE | Freq: Two times a day (BID) | ORAL | 0 refills | Status: DC

## 2021-04-23 NOTE — ED Triage Notes (Signed)
Pt states she was bit by something 3-4 days ago. Pt has raised red area to abdomen that she has marked the perimeter with. Redness does not appear outside original marked perimeter. Pt states she has been feeling nauseous and dizzy and that she checked her BP at home and it was elevated which concerned her. Pt also c/o pain to L flank area since the insect bite. Denies any difficulty with urination.

## 2021-04-23 NOTE — ED Provider Notes (Signed)
Asked to update patient on ultrasound results. Ultrasound negative for gallstones or other findings. Pt currently being treated for cellulitis however mentions that she was just on doxycycline recently for other issue. She is requesting another abx at this time. Advised to continue the doxy however have sent in keflex as well for insect bite/cellulitis. Pt in agreement with plan.    Tanda Rockers, PA-C 04/23/21 1703    Cheryll Cockayne, MD 04/30/21 [phone]

## 2021-04-23 NOTE — Discharge Instructions (Addendum)
As we discussed, you may have passed a kidney stone.  Follow-up later today for an ultrasound of your gallbladder. take the antibiotics for the red area to your abdomen.  If your gallbladder shows gallstones or other abnormality you can follow-up with Dr. Lovell Sheehan of general surgeon to discuss potential removal. return to the ED with spreading redness, fever, vomiting, unable to urinate or any other concerns.

## 2021-04-23 NOTE — ED Provider Notes (Signed)
[facility] EMERGENCY DEPARTMENT Provider Note   CSN: [id number] Arrival date & time: 04/23/21  0104     History Chief Complaint  Patient presents with   Insect Bite    Kaylee Short is a 30 y.o. female.  Patient with history of hypertension, endometriosis, previous substance abuse on Suboxone presenting with left-sided flank pain that onset this evening.  She reports sharp left flank pain that is constant.  Associated with some nausea but no vomiting.  Denies any injury.  Denies any pain with urination or blood in the urine.  Denies any fever.  Was diagnosed with a kidney stone in the past. She is concerned this could be related to an area on her abdomen where she thinks she was bitten 3 or 4 days ago.  She does not see what bit her.  She had a raised red area to her abdomen that is itchy and painful.  This is getting bigger.  She drew a line on the area of redness today. No chest pain or shortness of breath. Today while she was at home she began to feel increasing anxiety and checked her blood pressure and found to be elevated at 150/100.  She does take amlodipine and states compliance.  She denies any significant headache.  She has some lightheadedness at home which has since resolved.  No focal weakness, numbness or tingling.  No difficulty speaking or difficulty swallowing.  No bowel or bladder incontinence.  No fever or vomiting.  No history of IV drug abuse but has used pain pills in the past. Still has appendix and gallbladder  The history is provided by the patient.      Past Medical History:  Diagnosis Date   Asthma    Bronchitis    Hypertension     Patient Active Problem List   Diagnosis Date Noted   Closed nondisplaced fracture of distal phalanx of right ring finger 02/02/2017    Past Surgical History:  Procedure Laterality Date   CESAREAN SECTION       OB History     Gravida  3   Para  3   Term  2   Preterm  1   AB      Living  2      SAB       IAB      Ectopic      Multiple      Live Births              History reviewed. No pertinent family history.  Social History   Tobacco Use   Smoking status: Every Day    Packs/day: 1.00    Pack years: 0.00    Types: Cigarettes   Smokeless tobacco: Never  Vaping Use   Vaping Use: Never used  Substance Use Topics   Alcohol use: No   Drug use: No    Home Medications Prior to Admission medications   Medication Sig Start Date End Date Taking? Authorizing Provider  albuterol (PROVENTIL HFA;VENTOLIN HFA) 108 (90 Base) MCG/ACT inhaler Inhale 1-2 puffs into the lungs every 6 (six) hours as needed for wheezing or shortness of breath. 02/19/16   Elson Areas, PA-C  amLODipine (NORVASC) 5 MG tablet Take 5 mg by mouth daily. 01/01/18 01/01/19  [provider]  cyclobenzaprine (FLEXERIL) 10 MG tablet Take 1 tablet (10 mg total) by mouth 2 (two) times daily as needed for muscle spasms. 03/26/21   Lorelee New, PA-C  ibuprofen (ADVIL) 600  MG tablet Take 1 tablet (600 mg total) by mouth every 6 (six) hours as needed. 03/26/21   Lorelee New, PA-C  naproxen (NAPROSYN) 500 MG tablet Take 1 tablet (500 mg total) by mouth 2 (two) times daily. 09/20/20   Horton, Mayer Masker, MD  oseltamivir (TAMIFLU) 75 MG capsule Take 1 capsule (75 mg total) by mouth every 12 (twelve) hours. 11/20/18   Ivery Quale, PA-C  SUBOXONE 8-2 MG FILM DISSOLVE 1 FILM UNDER TONGUE TWICE DAILY AND 1/2 FILM UNDER TONGUE BEFORE BEDTIME. 01/27/21 07/26/21  Treasa School, PA-C  SUBOXONE 8-2 MG FILM PLACE 1 FILM UNDER TONGUE TWICE DAILY AND 1/2 FILM UNDER TONGUE BEFORE BEDTIME. 12/30/20 06/28/21  Treasa School, PA-C  SUBOXONE 8-2 MG FILM PLACE 1 FILM UNDER THE TONGUE 2 TIMES DAILY AND 1/2 FILM UNDER THE TONGUE BEFORE BEDTIME 12/02/20 05/31/21  Land, Aneta Mins, PA-C  SUBOXONE 8-2 MG FILM Dissolve 1 film under tongue twice daily and 1/2 film before bedtime. 04/21/21       Allergies    Patient has no known  allergies.  Review of Systems   Review of Systems  Constitutional:  Negative for activity change, appetite change, fatigue and fever.  HENT:  Negative for congestion and rhinorrhea.   Respiratory:  Negative for shortness of breath.   Cardiovascular:  Negative for chest pain.  Gastrointestinal:  Positive for abdominal pain and nausea.  Genitourinary:  Negative for dysuria, hematuria, vaginal bleeding and vaginal discharge.  Musculoskeletal:  Positive for back pain.  Skin:  Positive for rash.  Neurological:  Positive for dizziness, weakness and light-headedness. Negative for headaches.   all other systems are negative except as noted in the HPI and PMH.   Physical Exam Updated Vital Signs BP (!) 154/103 (BP Location: Left Arm)   Pulse 85   Temp 98.2 F (36.8 C) (Oral)   Resp 18   Ht 5' 7.5" (1.715 m)   Wt 89.8 kg   LMP  (LMP Unknown)   SpO2 95%   BMI 30.55 kg/m   Physical Exam Vitals and nursing note reviewed.  Constitutional:      General: She is not in acute distress.    Appearance: She is well-developed.  HENT:     Head: Normocephalic and atraumatic.     Mouth/Throat:     Pharynx: No oropharyngeal exudate.  Eyes:     Conjunctiva/sclera: Conjunctivae normal.     Pupils: Pupils are equal, round, and reactive to light.  Neck:     Comments: No meningismus. Cardiovascular:     Rate and Rhythm: Normal rate and regular rhythm.     Heart sounds: Normal heart sounds. No murmur heard. Pulmonary:     Effort: Pulmonary effort is normal. No respiratory distress.     Breath sounds: Normal breath sounds.  Abdominal:     Palpations: Abdomen is soft.     Tenderness: There is no abdominal tenderness. There is no guarding or rebound.     Comments: 2 cm circular area of erythema to the left abdominal wall.  No fluctuance or induration.  Abdomen soft without peritoneal signs  Musculoskeletal:        General: Tenderness present. Normal range of motion.     Cervical back: Normal  range of motion and neck supple.     Comments: Left paraspinal lumbar tenderness  Skin:    General: Skin is warm.  Neurological:     Mental Status: She is alert and oriented to person, place, and time.  Cranial Nerves: No cranial nerve deficit.     Motor: No abnormal muscle tone.     Coordination: Coordination normal.     Comments:  5/5 strength throughout. CN 2-12 intact.Equal grip strength.   Psychiatric:        Behavior: Behavior normal.    ED Results / Procedures / Treatments   Labs (all labs ordered are listed, but only abnormal results are displayed) Labs Reviewed  URINALYSIS, ROUTINE W REFLEX MICROSCOPIC - Abnormal; Notable for the following components:      Result Value   Color, Urine STRAW (*)    All other components within normal limits  BASIC METABOLIC PANEL - Abnormal; Notable for the following components:   Glucose, Bld 100 (*)    All other components within normal limits  PREGNANCY, URINE  CBC WITH DIFFERENTIAL/PLATELET  HEPATIC FUNCTION PANEL  LIPASE, BLOOD    EKG None  Radiology CT Renal Stone Study  Result Date: 04/23/2021 CLINICAL DATA:  Initial evaluation for acute flank pain, kidney stone suspected. EXAM: CT ABDOMEN AND PELVIS WITHOUT CONTRAST TECHNIQUE: Multidetector CT imaging of the abdomen and pelvis was performed following the standard protocol without IV contrast. COMPARISON:  Prior CT from 09/19/2020. FINDINGS: Lower chest: Minimal subsegmental atelectasis noted within the right middle lobe. Visualized lung bases are otherwise clear. Decreased density within the cardiac blood pool suggestive of anemia. Hepatobiliary: Liver demonstrates a normal unenhanced appearance. Gallbladder contracted with suggestion of small volume free pericholecystic fluid (series 2, image 28). No visible cholelithiasis or biliary dilatation. Pancreas: Pancreas within normal limits. Spleen: Spleen size at the upper limits of normal. Spleen otherwise unremarkable.  Adrenals/Urinary Tract: Adrenal glands within normal limits. Kidneys equal in size without nephrolithiasis or hydronephrosis. No radiopaque calculi seen along the course of either renal collecting system. No hydroureter. Partially distended bladder within normal limits. No layering stones within the bladder lumen. Stomach/Bowel: Stomach within normal limits. No evidence for bowel obstruction. Normal appendix. No acute inflammatory changes seen about the bowels. Mild sigmoid diverticulosis without evidence for acute diverticulitis. Vascular/Lymphatic: Intra-abdominal aorta of normal caliber. No adenopathy. Reproductive: Uterus and ovaries within normal limits. Other: No free air or fluid. Small fat containing paraumbilical hernia without associated inflammation. Musculoskeletal: No acute osseous finding. No discrete or worrisome osseous lesions. IMPRESSION: 1. No CT evidence for nephrolithiasis or obstructive uropathy. 2. Contracted gallbladder with small volume free pericholecystic fluid. No visible cholelithiasis or biliary dilatation. Finding of uncertain significance, and correlation with LFTs is suggested. Additionally, correlation with dedicated right upper quadrant ultrasound could be performed for further evaluation as warranted. 3. Mild sigmoid diverticulosis without evidence for acute diverticulitis. Electronically Signed   By: Rise MuBenjamin  McClintock M.D.   On: 04/23/2021 03:48    Procedures Procedures   Medications Ordered in ED Medications  ibuprofen (ADVIL) tablet 400 mg (has no administration in time range)    ED Course  I have reviewed the triage vital signs and the nursing notes.  Pertinent labs & imaging results that were available during my care of the patient were reviewed by me and considered in my medical decision making (see chart for details).    MDM Rules/Calculators/A&P                         Flank pain with nausea since this evening.  Elevated blood pressure at home.   Patient concerned this could be related to suspected insect bite that she sustained 3 to 4 days ago.  She does  have an area of apparent cellulitis to abdominal wall without evidence of abscess.  She appears systemically well. No headache.  No neurological deficits.  No chest pain or shortness of breath  Urinalysis is negative.  Blood pressure has improved on its own to 131/90. Flank pain has resolved after Motrin.  CT scan shows no renal stone or other acute pathology.  Does show contracted gallbladder with pericholecystic fluid.  She has no right upper quadrant pain on exam  Ultrasound is not available.  We will arrange for outpatient ultrasound study tomorrow.  LFTs and lipase are normal.  Patient's pain has resolved.  Her blood pressure has normalized.  Suspect she may have passed a kidney stone.  Urinalysis shows no evidence of infection.  Low suspicion for cord compression or cauda equina. No focal weakness, numbness or tingling.  No bowel or bladder incontinence.  No fever or vomiting.  No history of IV drug abuse or cancer.  We will treat area of cellulitis with antibiotics.  Patient to be scheduled for ultrasound of her gallbladder later today. Unable to give pain medications when she is taking Suboxone. She is not interested in pain medication anyway.   Discussed returning to the ED with worsening pain, fever, vomiting, not able to urinate or any other concerns. Final Clinical Impression(s) / ED Diagnoses Final diagnoses:  Insect bite of abdominal wall, initial encounter  Flank pain    Rx / DC Orders ED Discharge Orders     None        Crystol Walpole, Jeannett Senior, MD 04/23/21 [phone]

## 2021-05-19 ENCOUNTER — Other Ambulatory Visit (HOSPITAL_COMMUNITY): Payer: Self-pay

## 2021-05-19 MED ORDER — SUBOXONE 8-2 MG SL FILM
ORAL_FILM | SUBLINGUAL | 0 refills | Status: DC
  Filled 2021-05-19: qty 70, 28d supply, fill #0

## 2021-06-16 ENCOUNTER — Other Ambulatory Visit (HOSPITAL_COMMUNITY): Payer: Self-pay

## 2021-06-16 MED ORDER — SUBOXONE 8-2 MG SL FILM
ORAL_FILM | SUBLINGUAL | 0 refills | Status: DC
  Filled 2021-06-16: qty 70, 28d supply, fill #0

## 2021-07-14 ENCOUNTER — Other Ambulatory Visit (HOSPITAL_COMMUNITY): Payer: Self-pay

## 2021-07-14 MED ORDER — SUBOXONE 8-2 MG SL FILM
ORAL_FILM | SUBLINGUAL | 0 refills | Status: DC
  Filled 2021-07-14: qty 75, 30d supply, fill #0

## 2021-08-11 ENCOUNTER — Other Ambulatory Visit (HOSPITAL_COMMUNITY): Payer: Self-pay

## 2021-08-11 MED ORDER — SUBOXONE 8-2 MG SL FILM
ORAL_FILM | SUBLINGUAL | 0 refills | Status: DC
  Filled 2021-08-11: qty 75, 30d supply, fill #0

## 2021-09-08 ENCOUNTER — Other Ambulatory Visit (HOSPITAL_COMMUNITY): Payer: Self-pay

## 2021-09-08 MED ORDER — SUBOXONE 8-2 MG SL FILM
ORAL_FILM | SUBLINGUAL | 0 refills | Status: AC
  Filled 2021-09-08: qty 75, 30d supply, fill #0

## 2022-01-06 ENCOUNTER — Encounter (HOSPITAL_COMMUNITY): Payer: Medicaid Other

## 2022-01-22 ENCOUNTER — Emergency Department (HOSPITAL_COMMUNITY)
Admission: EM | Admit: 2022-01-22 | Discharge: 2022-01-22 | Disposition: A | Discharge status: Home / Self Care | Payer: Medicaid Other | Attending: Emergency Medicine | Admitting: Emergency Medicine

## 2022-01-22 ENCOUNTER — Other Ambulatory Visit: Payer: Self-pay

## 2022-01-22 ENCOUNTER — Encounter (HOSPITAL_COMMUNITY): Payer: Self-pay | Admitting: Emergency Medicine

## 2022-01-22 DIAGNOSIS — M25532 Pain in left wrist: Secondary | ICD-10-CM | POA: Insufficient documentation

## 2022-01-22 DIAGNOSIS — M25531 Pain in right wrist: Secondary | ICD-10-CM | POA: Insufficient documentation

## 2022-01-22 DIAGNOSIS — Z79899 Other long term (current) drug therapy: Secondary | ICD-10-CM | POA: Insufficient documentation

## 2022-01-22 HISTORY — DX: Carpal tunnel syndrome, unspecified upper limb: G56.00

## 2022-01-22 HISTORY — DX: Lateral epicondylitis, unspecified elbow: M77.10

## 2022-01-22 MED ORDER — KETOROLAC TROMETHAMINE 15 MG/ML IJ SOLN
15.0000 mg | Freq: Once | INTRAMUSCULAR | Status: AC
  Administered 2022-01-22: 15 mg via INTRAMUSCULAR
  Filled 2022-01-22: qty 1

## 2022-01-22 NOTE — Discharge Instructions (Signed)
Please take 600 mg ibuprofen every 6 hours like we discussed today.  Please return to the emergency department for any worsening symptoms that we discussed as well.  Please call your doctor on Monday to schedule follow-up appointment.  Thank you for letting me participate in your care today. ?

## 2022-01-22 NOTE — ED Triage Notes (Addendum)
Pt to the ED with complaints of bilateral wrist pain after receiving injections in both wrists yesterday for carpal tunnel. ? ?Pt states she is having severe pain on the right wrist with a knot on her right forearm that has moved up. ? ?Pt states she has bilateral hand numbness. ? ?

## 2022-01-22 NOTE — ED Provider Notes (Signed)
?[facility] EMERGENCY DEPARTMENT ?Provider Note ? ? ?CSN: [id number] ?Arrival date & time: 01/22/22  1121 ? ?  ? ?History ?Chief Complaint  ?Patient presents with  ? Wrist Pain  ? ? ?Kaylee Short is a 31 y.o. female with history of bilateral carpal tunnel who presents emergency department for bilateral wrist pain worse on the right than left started yesterday after receiving steroid injections in both of her wrist.  Patient had injections done about 6 months ago with no immediate complications at that time.  Yesterday she got injections and went to work and as a day progressed began having pain in the wrists.  Patient denies any streaking redness, fever, chills, or significant swelling. ? ? ?Wrist Pain ? ? ?  ? ?Home Medications ?Prior to Admission medications   ?Medication Sig Start Date End Date Taking? Authorizing Provider  ?albuterol (PROVENTIL HFA;VENTOLIN HFA) 108 (90 Base) MCG/ACT inhaler Inhale 1-2 puffs into the lungs every 6 (six) hours as needed for wheezing or shortness of breath. 02/19/16   Elson Areas, PA-C  ?amLODipine (NORVASC) 5 MG tablet Take 5 mg by mouth daily. 01/01/18 01/01/19  [provider]  ?cyclobenzaprine (FLEXERIL) 10 MG tablet Take 1 tablet (10 mg total) by mouth 2 (two) times daily as needed for muscle spasms. 03/26/21   Lorelee New, PA-C  ?doxycycline (VIBRAMYCIN) 100 MG capsule Take 1 capsule (100 mg total) by mouth 2 (two) times daily. 04/23/21   Rancour, Jeannett Senior, MD  ?ibuprofen (ADVIL) 600 MG tablet Take 1 tablet (600 mg total) by mouth every 6 (six) hours as needed. 03/26/21   Lorelee New, PA-C  ?naproxen (NAPROSYN) 500 MG tablet Take 1 tablet (500 mg total) by mouth 2 (two) times daily. 09/20/20   Horton, Mayer Masker, MD  ?oseltamivir (TAMIFLU) 75 MG capsule Take 1 capsule (75 mg total) by mouth every 12 (twelve) hours. 11/20/18   Ivery Quale, PA-C  ?SUBOXONE 8-2 MG FILM Dissolve 1 film under tongue twice daily and 1/2 of film under tongue before bedtime.  09/08/21     ?   ? ?Allergies    ?Patient has no known allergies.   ? ?Review of Systems   ?Review of Systems  ?All other systems reviewed and are negative. ? ?Physical Exam ?Updated Vital Signs ?BP (!) 159/94 (BP Location: Left Arm)   Pulse 91   Temp 99 ?F (37.2 ?C) (Oral)   Resp 18   Ht 5' 7.5" (1.715 m)   Wt 92.5 kg   LMP 12/24/2021 (Approximate)   SpO2 99%   BMI 31.48 kg/m?  ?Physical Exam ?Vitals and nursing note reviewed.  ?Constitutional:   ?   Appearance: Normal appearance.  ?HENT:  ?   Head: Normocephalic and atraumatic.  ?Eyes:  ?   General:     ?   Right eye: No discharge.     ?   Left eye: No discharge.  ?   Conjunctiva/sclera: Conjunctivae normal.  ?Pulmonary:  ?   Effort: Pulmonary effort is normal.  ?Musculoskeletal:  ?   Comments: Forearm is mildly tender to palpation on the right. Left is normal. Patient unable to make a fist secondary to pain on the right.  Strong 2+ radial pulses felt bilaterally.  No erythema or significant swelling.  ?Skin: ?   General: Skin is warm and dry.  ?   Findings: No rash.  ?Neurological:  ?   General: No focal deficit present.  ?   Mental Status: She is alert.  ?  Psychiatric:     ?   Mood and Affect: Mood normal.     ?   Behavior: Behavior normal.  ? ? ?ED Results / Procedures / Treatments   ?Labs ?(all labs ordered are listed, but only abnormal results are displayed) ?Labs Reviewed - No data to display ? ?EKG ?None ? ?Radiology ?No results found. ? ?Procedures ?Procedures  ? ? ?Medications Ordered in ED ?Medications  ?ketorolac (TORADOL) 15 MG/ML injection 15 mg (15 mg Intramuscular Given 01/22/22 1251)  ? ? ?ED Course/ Medical Decision Making/ A&P ?  ?                        ?Medical Decision Making ?Risk ?Prescription drug management. ? ? ?Kaylee Short is a 31 y.o. female who presents to the ED with bilateral wrist pain after receiving what appears to be cortisone injections yesterday.  I am unable to see any records from that visit however based on the  patient's description it seems to be that there were some kind of steroid injections.  I am not seeing any clinical signs of infectious etiology on the bilateral wrists or forearms.  There is no significant swelling or erythema.  They are somewhat tender to palpation and patient does have a hard time closing her right hand secondary to pain.  There is mild swelling to the anterior wrist bilaterally.  I have a low suspicion for flexor tenosynovitis at this time.  I doubt compartment syndrome.  Ketorolac was given for pain which improved her pain somewhat.  I instructed her to take 600 mg ibuprofen every 6 hours for pain and swelling.  I also encouraged her to do heat therapy.  Patient will call the neurologist to perform the injections on Monday.  I discussed red flag symptoms with her at the bedside including fever, chills, increased level of pain, significant swelling, redness, or any other concerns that she might have.  Patient understood.  She is safe for discharge. ? ?Final Clinical Impression(s) / ED Diagnoses ?Final diagnoses:  ?Pain in both wrists  ? ? ?Rx / DC Orders ?ED Discharge Orders   ? ? None  ? ?  ? ? ?  ?Teressa Lower, PA-C ?01/22/22 1338 ? ?  ?Cathren Laine, MD ?01/24/22 1506 ? ?

## 2022-01-27 ENCOUNTER — Other Ambulatory Visit: Payer: Self-pay | Admitting: *Deleted

## 2022-02-07 NOTE — Progress Notes (Signed)
? ? ? ? ? ? ? ?[facility]ASC[facility]  ?         OF [place]  ?History and Physical ? ? ?Kaylee Short is a 31 y.o. female who presents with leg swelling.  She states that she has noticed her left leg swelling worsening over the past several months.  She states that it gets better after she has been in bed at night and swells throughout the day.  She states that last year, she had some leg swelling and wen to [facility] but was unable to do the u/s at that time and when she went back to get the u/s, there was no evidence of DVT.  She was never placed on AC.  There is no family hx of DVT but her grandmothers have hx of varicose veins.  She does not wear compression.  She does not elevate her legs on a regular basis.  She does not have skin color changes present.  She does work at the [employer] and is on her feet most of the day.  She has hx of pregnancy x 3.  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  She states she does have some stinging in both her legs from time to time and itching around the spider veins.  ? ? ?The pt is not on a statin for cholesterol management.  ?The pt is not on a daily aspirin.   Other AC:  none ?The pt is not on medication for hypertension.   ?The pt is not diabetic.   ?Tobacco hx:  former ? ?There is no family hx of AAA. ? ? ?Past Medical History:  ?Diagnosis Date  ? Asthma   ? Bronchitis   ? Carpal tunnel syndrome   ? Bilateral  ? Hypertension   ? Tennis elbow   ? bilateral  ? ? ?Past Surgical History:  ?Procedure Laterality Date  ? CESAREAN SECTION    ? ? ?Social History  ? ?Socioeconomic History  ? Marital status: Single  ?  Spouse name: Not on file  ? Number of children: Not on file  ? Years of education: Not on file  ? Highest education level: Not on file  ?Occupational History  ? Not on file  ?Tobacco Use  ? Smoking status: Former  ?  Packs/day: 1.00  ?  Types: Cigarettes  ?  Quit date: 01/22/2021  ?  Years since  quitting: 1.0  ? Smokeless tobacco: Never  ?Vaping Use  ? Vaping Use: Never used  ?Substance and Sexual Activity  ? Alcohol use: No  ? Drug use: No  ? Sexual activity: Yes  ?  Birth control/protection: Pill, None  ?Other Topics Concern  ? Not on file  ?Social History Narrative  ? Not on file  ? ?Social Determinants of Health  ? ?Financial Resource Strain: Not on file  ?Food Insecurity: Not on file  ?Transportation Needs: Not on file  ?Physical Activity: Not on file  ?Stress: Not on file  ?Social Connections: Not on file  ?Intimate Partner Violence: Not on file  ? ? ?+ family hx for varicose veins.   ?- family hx for AAA ? ?Current Outpatient Medications  ?Medication Sig Dispense Refill  ? albuterol (PROVENTIL HFA;VENTOLIN HFA) 108 (90 Base) MCG/ACT inhaler Inhale 1-2 puffs into the lungs every 6 (six) hours as needed for wheezing or shortness of breath. 1 Inhaler 0  ? amLODipine (NORVASC)  5 MG tablet Take 5 mg by mouth daily.    ? cyclobenzaprine (FLEXERIL) 10 MG tablet Take 1 tablet (10 mg total) by mouth 2 (two) times daily as needed for muscle spasms. 20 tablet 0  ? doxycycline (VIBRAMYCIN) 100 MG capsule Take 1 capsule (100 mg total) by mouth 2 (two) times daily. 20 capsule 0  ? ibuprofen (ADVIL) 600 MG tablet Take 1 tablet (600 mg total) by mouth every 6 (six) hours as needed. 30 tablet 0  ? naproxen (NAPROSYN) 500 MG tablet Take 1 tablet (500 mg total) by mouth 2 (two) times daily. 30 tablet 0  ? oseltamivir (TAMIFLU) 75 MG capsule Take 1 capsule (75 mg total) by mouth every 12 (twelve) hours. 10 capsule 0  ? SUBOXONE 8-2 MG FILM Dissolve 1 film under tongue twice daily and 1/2 of film under tongue before bedtime. 75 each 0  ? ?No current facility-administered medications for this visit.  ? ? ?No Known Allergies ? ?REVIEW OF SYSTEMS:  ? ?[X]  denotes positive finding, [ ]  denotes negative finding ?Cardiac  Comments:  ?Chest pain or chest pressure:    ?Shortness of breath upon exertion:    ?Short of breath when  lying flat:    ?Irregular heart rhythm:    ?    ?Vascular    ?Pain in calf, thigh, or hip brought on by ambulation:    ?Pain in feet at night that wakes you up from your sleep:     ?Blood clot in your veins:    ?Leg swelling:  x   ?    ?Pulmonary    ?Oxygen at home:    ?Productive cough:     ?Wheezing:     ?    ?Neurologic    ?Sudden weakness in arms or legs:     ?Sudden numbness in arms or legs:     ?Sudden onset of difficulty speaking or slurred speech:    ?Temporary loss of vision in one eye:     ?Problems with dizziness:     ?    ?Gastrointestinal    ?Blood in stool:     ?Vomited blood:     ?    ?Genitourinary    ?Burning when urinating:     ?Blood in urine:    ?    ?Psychiatric    ?Major depression:     ?    ?Hematologic    ?Bleeding problems:    ?Problems with blood clotting too easily:    ?    ?Skin    ?Rashes or ulcers:    ?    ?Constitutional    ?Fever or chills:    ? ? ?PHYSICAL EXAMINATION: ? ?Today's Vitals  ? 02/09/22 1142  ?BP: (!) 141/95  ?Pulse: 88  ?Resp: 16  ?Temp: 98.3 ?F (36.8 ?C)  ?TempSrc: Temporal  ?SpO2: 98%  ?Weight: 200 lb (90.7 kg)  ?Height: 5' 7.5" (1.715 m)  ?PainSc: 8   ? ?Body mass index is 30.86 kg/m?. ? ? ?General:  WDWN in NAD; vital signs documented above ?Gait: Not observed ?HENT: WNL, normocephalic ?Pulmonary: normal non-labored breathing without wheezing ?Cardiac: regular HR; without carotid bruits ?Abdomen: soft, NT, no masses; aortic pulse is not palpable ?Skin: without rashes ?Vascular Exam/Pulses: ? Right Left  ?Radial 2+ (normal) 2+ (normal)  ?DP 2+ (normal) 2+ (normal)  ?PT 2+ (normal) 2+ (normal)  ? ?Extremities: +LLE swelling; scattered small spider veins on both legs  ?Neurologic: A&O X 3;  moving all extremities  equally ?Psychiatric:  The pt has Normal affect. ? ? ?Non-Invasive Vascular Imaging:   ?Venous duplex on 02/09/2022: ?Venous Reflux Times  ?+--------------+---------+------+-----------+------------+-----------------  ?----+  ?RIGHT         Reflux  NoRefluxReflux TimeDiameter cmsComments          ?                        Yes                                           ?+--------------+---------+------+-----------+------------+-----------------  ?CFV                     yes   >1 second                               ?+--------------+---------+------+-----------+------------+----------------- ?FV mid        no                                                      ? +--------------+---------+------+-----------+------------+-----------------  ?Popliteal     no                                                      ?+--------------+---------+------+-----------+------------+-----------------  ?GSV at [facility]    no                            0.49                      ?+--------------+---------+------+-----------+------------+-----------------  ?GSV prox thighno                            0.43                      ?+--------------+---------+------+-----------+------------+-----------------  ?GSV mid thigh no                            0.23    large superficial branch at this level   ?+--------------+---------+------+-----------+------------+-----------------  ?GSV dist thighno                            0.16                      ?+--------------+---------+------+-----------+------------+-----------------  ?GSV at knee   no                            0.14                      ?+--------------+---------+------+-----------+------------+-----------------  ?GSV prox calf no  0.32    branch joins at This level              ?+--------------+---------+------+-----------+------------+-----------------  ?SSV Pop Fossa no                            0.19                      ?+--------------+---------+------+-----------+------------+-----------------  ?SSV prox calf no                            0.20                       ?+--------------+---------+------+-----------+------------+-----------------  ?SSV mid calf  no                            0.20                      ?+--------------+---------+------+-----------+------------+-----------------  ? ?   ?+--------------+---------+------+-----------+------------+--------+  ?LEFT          Reflux NoRefluxReflux TimeDiameter cmsComments

## 2022-02-09 ENCOUNTER — Other Ambulatory Visit: Payer: Self-pay

## 2022-02-09 ENCOUNTER — Encounter: Payer: Self-pay | Admitting: Physician Assistant

## 2022-02-09 ENCOUNTER — Ambulatory Visit (HOSPITAL_COMMUNITY)
Admission: RE | Admit: 2022-02-09 | Discharge: 2022-02-09 | Disposition: A | Discharge status: Home / Self Care | Payer: Medicaid Other | Source: Ambulatory Visit | Attending: Physician Assistant | Admitting: Physician Assistant

## 2022-02-09 ENCOUNTER — Ambulatory Visit: Payer: Medicaid Other | Admitting: Physician Assistant

## 2022-02-09 VITALS — BP 141/95 | HR 88 | Temp 98.3°F | Resp 16 | Ht 67.5 in | Wt 200.0 lb

## 2022-02-09 DIAGNOSIS — I83893 Varicose veins of bilateral lower extremities with other complications: Secondary | ICD-10-CM | POA: Insufficient documentation

## 2022-02-09 DIAGNOSIS — M7989 Other specified soft tissue disorders: Secondary | ICD-10-CM

## 2022-02-15 ENCOUNTER — Other Ambulatory Visit: Payer: Self-pay

## 2022-02-15 DIAGNOSIS — M7989 Other specified soft tissue disorders: Secondary | ICD-10-CM

## 2022-03-09 ENCOUNTER — Ambulatory Visit: Payer: Medicaid Other | Admitting: Vascular Surgery

## 2022-03-09 ENCOUNTER — Encounter: Payer: Self-pay | Admitting: Vascular Surgery

## 2022-03-09 VITALS — BP 126/87 | HR 84 | Temp 98.4°F | Resp 20 | Ht 67.5 in | Wt 206.0 lb

## 2022-03-09 DIAGNOSIS — M7989 Other specified soft tissue disorders: Secondary | ICD-10-CM | POA: Diagnosis not present

## 2022-03-09 DIAGNOSIS — I871 Compression of vein: Secondary | ICD-10-CM | POA: Diagnosis not present

## 2022-03-09 NOTE — H&P (View-Only) (Signed)
Patient ID: Kaylee Short, female   DOB: 1991-03-11, 31 y.o.   MRN: [id number]  Reason for Consult: Follow-up   Referred by [facility]  Subjective:     HPI:  Kaylee Short is a 31 y.o. female history of left lower extremity swelling.  She states that during pregnancies she has had severe swelling left greater than right but is also always noted some swelling on the left.  She has attempted to wear compression stockings but this has been somewhat difficult.  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  She now follows up with CT venogram.  She does not have any skin changes or ulceration of the left lower extremity.  Past Medical History:  Diagnosis Date   Asthma    Bronchitis    Carpal tunnel syndrome    Bilateral   Hypertension    Tennis elbow    bilateral   History reviewed. No pertinent family history. Past Surgical History:  Procedure Laterality Date   CESAREAN SECTION      Short Social History:  Social History   Tobacco Use   Smoking status: Former    Packs/day: 1.00    Types: Cigarettes    Quit date: 01/22/2021    Years since quitting: 1.1   Smokeless tobacco: Never  Substance Use Topics   Alcohol use: No    No Known Allergies  Current Outpatient Medications  Medication Sig Dispense Refill   albuterol (PROVENTIL HFA;VENTOLIN HFA) 108 (90 Base) MCG/ACT inhaler Inhale 1-2 puffs into the lungs every 6 (six) hours as needed for wheezing or shortness of breath. 1 Inhaler 0   ibuprofen (ADVIL) 600 MG tablet Take 1 tablet (600 mg total) by mouth every 6 (six) hours as needed. 30 tablet 0   SUBOXONE 8-2 MG FILM Dissolve 1 film under tongue twice daily and 1/2 of film under tongue before bedtime. 75 each 0   amLODipine (NORVASC) 5 MG tablet Take 5 mg by mouth daily.     No current facility-administered medications for this visit.    Review of Systems  Constitutional:  Constitutional  negative. HENT: HENT negative.  Eyes: Eyes negative.  Respiratory: Respiratory negative.  Cardiovascular: Positive for leg swelling.  GI: Gastrointestinal negative.  Musculoskeletal: Musculoskeletal negative.  Neurological: Neurological negative. Hematologic: Hematologic/lymphatic negative.  Psychiatric: Psychiatric negative.       Objective:  Objective   Vitals:   03/09/22 0944  BP: 126/87  Pulse: 84  Resp: 20  Temp: 98.4 F (36.9 C)  SpO2: 96%  Weight: 206 lb (93.4 kg)  Height: 5' 7.5" (1.715 m)   Body mass index is 31.79 kg/m.  Physical Exam HENT:     Head: Normocephalic.     Nose: Nose normal.  Eyes:     Pupils: Pupils are equal, round, and reactive to light.  Cardiovascular:     Pulses: Normal pulses.  Pulmonary:     Effort: Pulmonary effort is normal.  Abdominal:     General: Abdomen is flat.     Palpations: Abdomen is soft.  Musculoskeletal:     Cervical back: Normal range of motion.     Right lower leg: No edema.     Left lower leg: Edema present.     Comments: Patient has been wearing compression stockings left leg is obviously larger than the right  Skin:    General: Skin is warm and dry.     Capillary Refill: Capillary refill takes  less than 2 seconds.  Neurological:     General: No focal deficit present.     Mental Status: She is alert.  Psychiatric:        Mood and Affect: Mood normal.        Thought Content: Thought content normal.    Data: CT Venogram IMPRESSION: 1. Anatomic compression of the LEFT CIV by the crossing RIGHT CIA, as can be seen in May-Thurner physiology. No CTV evidence of thrombosis within the imaged portions of proximal lower extremity veins. Recommend non-emergent, outpatient vascular Interventional Radiology (VIR) referral for further evaluation. 2. No acute abdominopelvic process. Additional incidental, chronic and senescent findings as above.      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  She is not planning on any further children.  I have recommended venogram from a left common femoral vein approach with probable stenting.  After this I will place her on Plavix for at least 3 months after which she can transition to aspirin and we discussed the need for routine follow-up after.  She demonstrates good understanding we will get her scheduled in the near future.     Maeola Harman MD [facility]

## 2022-03-09 NOTE — Progress Notes (Signed)
 Patient ID: Kaylee Short, female   DOB: 11/09/1991, 30 y.o.   MRN: [id number]  Reason for Consult: Follow-up   Referred by [place]  Subjective:     HPI:  Kaylee Short is a 30 y.o. female history of left lower extremity swelling.  She states that during pregnancies she has had severe swelling left greater than right but is also always noted some swelling on the left.  She has attempted to wear compression stockings but this has been somewhat difficult.  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  She now follows up with CT venogram.  She does not have any skin changes or ulceration of the left lower extremity.  Past Medical History:  Diagnosis Date   Asthma    Bronchitis    Carpal tunnel syndrome    Bilateral   Hypertension    Tennis elbow    bilateral   History reviewed. No pertinent family history. Past Surgical History:  Procedure Laterality Date   CESAREAN SECTION      Short Social History:  Social History   Tobacco Use   Smoking status: Former    Packs/day: 1.00    Types: Cigarettes    Quit date: 01/22/2021    Years since quitting: 1.1   Smokeless tobacco: Never  Substance Use Topics   Alcohol use: No    No Known Allergies  Current Outpatient Medications  Medication Sig Dispense Refill   albuterol (PROVENTIL HFA;VENTOLIN HFA) 108 (90 Base) MCG/ACT inhaler Inhale 1-2 puffs into the lungs every 6 (six) hours as needed for wheezing or shortness of breath. 1 Inhaler 0   ibuprofen (ADVIL) 600 MG tablet Take 1 tablet (600 mg total) by mouth every 6 (six) hours as needed. 30 tablet 0   SUBOXONE 8-2 MG FILM Dissolve 1 film under tongue twice daily and 1/2 of film under tongue before bedtime. 75 each 0   amLODipine (NORVASC) 5 MG tablet Take 5 mg by mouth daily.     No current facility-administered medications for this visit.    Review of Systems  Constitutional:  Constitutional  negative. HENT: HENT negative.  Eyes: Eyes negative.  Respiratory: Respiratory negative.  Cardiovascular: Positive for leg swelling.  GI: Gastrointestinal negative.  Musculoskeletal: Musculoskeletal negative.  Neurological: Neurological negative. Hematologic: Hematologic/lymphatic negative.  Psychiatric: Psychiatric negative.       Objective:  Objective   Vitals:   03/09/22 0944  BP: 126/87  Pulse: 84  Resp: 20  Temp: 98.4 F (36.9 C)  SpO2: 96%  Weight: 206 lb (93.4 kg)  Height: 5' 7.5" (1.715 m)   Body mass index is 31.79 kg/m.  Physical Exam HENT:     Head: Normocephalic.     Nose: Nose normal.  Eyes:     Pupils: Pupils are equal, round, and reactive to light.  Cardiovascular:     Pulses: Normal pulses.  Pulmonary:     Effort: Pulmonary effort is normal.  Abdominal:     General: Abdomen is flat.     Palpations: Abdomen is soft.  Musculoskeletal:     Cervical back: Normal range of motion.     Right lower leg: No edema.     Left lower leg: Edema present.     Comments: Patient has been wearing compression stockings left leg is obviously larger than the right  Skin:    General: Skin is warm and dry.     Capillary Refill: Capillary refill takes   less than 2 seconds.  Neurological:     General: No focal deficit present.     Mental Status: She is alert.  Psychiatric:        Mood and Affect: Mood normal.        Thought Content: Thought content normal.    Data: CT Venogram IMPRESSION: 1. Anatomic compression of the LEFT CIV by the crossing RIGHT CIA, as can be seen in May-Thurner physiology. No CTV evidence of thrombosis within the imaged portions of proximal lower extremity veins. Recommend non-emergent, outpatient vascular Interventional Radiology (VIR) referral for further evaluation. 2. No acute abdominopelvic process. Additional incidental, chronic and senescent findings as above.      Assessment/Plan:    30-year-old female with May Thurner  syndrome.  She is not planning on any further children.  I have recommended venogram from a left common femoral vein approach with probable stenting.  After this I will place her on Plavix for at least 3 months after which she can transition to aspirin and we discussed the need for routine follow-up after.  She demonstrates good understanding we will get her scheduled in the near future.     Debora Stockdale Christopher Nachman Sundt MD [facility] [place]   

## 2022-03-18 ENCOUNTER — Other Ambulatory Visit: Payer: Self-pay

## 2022-04-04 ENCOUNTER — Encounter (HOSPITAL_COMMUNITY)
Admission: RE | Disposition: A | Discharge status: Home / Self Care | Payer: Self-pay | Source: Home / Self Care | Attending: Vascular Surgery

## 2022-04-04 ENCOUNTER — Observation Stay (HOSPITAL_COMMUNITY)
Admission: RE | Admit: 2022-04-04 | Discharge: 2022-04-06 | Disposition: A | Discharge status: Home / Self Care | Payer: Medicaid Other | Attending: Vascular Surgery | Admitting: Vascular Surgery

## 2022-04-04 ENCOUNTER — Other Ambulatory Visit: Payer: Self-pay

## 2022-04-04 DIAGNOSIS — I871 Compression of vein: Principal | ICD-10-CM | POA: Insufficient documentation

## 2022-04-04 DIAGNOSIS — Z87891 Personal history of nicotine dependence: Secondary | ICD-10-CM | POA: Insufficient documentation

## 2022-04-04 DIAGNOSIS — I10 Essential (primary) hypertension: Secondary | ICD-10-CM | POA: Diagnosis not present

## 2022-04-04 DIAGNOSIS — J45909 Unspecified asthma, uncomplicated: Secondary | ICD-10-CM | POA: Diagnosis not present

## 2022-04-04 HISTORY — PX: IVC VENOGRAPHY: CATH118301

## 2022-04-04 HISTORY — PX: PERIPHERAL VASCULAR INTERVENTION: CATH118257

## 2022-04-04 HISTORY — PX: LOWER EXTREMITY VENOGRAPHY: CATH118253

## 2022-04-04 LAB — POCT I-STAT, CHEM 8
BUN: 10 mg/dL (ref 6–20)
Calcium, Ion: 1.19 mmol/L (ref 1.15–1.40)
Chloride: 104 mmol/L (ref 98–111)
Creatinine, Ser: 0.6 mg/dL (ref 0.44–1.00)
Glucose, Bld: 101 mg/dL — ABNORMAL HIGH (ref 70–99)
HCT: 39 % (ref 36.0–46.0)
Hemoglobin: 13.3 g/dL (ref 12.0–15.0)
Potassium: 4 mmol/L (ref 3.5–5.1)
Sodium: 139 mmol/L (ref 135–145)
TCO2: 29 mmol/L (ref 22–32)

## 2022-04-04 LAB — CBC
HCT: 36.5 % (ref 36.0–46.0)
Hemoglobin: 12.7 g/dL (ref 12.0–15.0)
MCH: 30.2 pg (ref 26.0–34.0)
MCHC: 34.8 g/dL (ref 30.0–36.0)
MCV: 86.9 fL (ref 80.0–100.0)
Platelets: 146 10*3/uL — ABNORMAL LOW (ref 150–400)
RBC: 4.2 MIL/uL (ref 3.87–5.11)
RDW: 11.9 % (ref 11.5–15.5)
WBC: 3.2 10*3/uL — ABNORMAL LOW (ref 4.0–10.5)
nRBC: 0 % (ref 0.0–0.2)

## 2022-04-04 LAB — POCT ACTIVATED CLOTTING TIME: Activated Clotting Time: 209 seconds

## 2022-04-04 LAB — GLUCOSE, CAPILLARY: Glucose-Capillary: 101 mg/dL — ABNORMAL HIGH (ref 70–99)

## 2022-04-04 SURGERY — LOWER EXTREMITY VENOGRAPHY
Anesthesia: LOCAL

## 2022-04-04 MED ORDER — ACETAMINOPHEN 325 MG PO TABS: 650.0000 mg | ORAL_TABLET | ORAL | Status: DC | PRN

## 2022-04-04 MED ORDER — KETOROLAC TROMETHAMINE 60 MG/2ML IM SOLN
60.0000 mg | INTRAMUSCULAR | Status: AC
  Filled 2022-04-04: qty 2

## 2022-04-04 MED ORDER — SODIUM CHLORIDE 0.9 % IV SOLN: INTRAVENOUS | Status: DC

## 2022-04-04 MED ORDER — FENTANYL CITRATE (PF) 100 MCG/2ML IJ SOLN
INTRAMUSCULAR | Status: AC
  Filled 2022-04-04: qty 2

## 2022-04-04 MED ORDER — HYDROMORPHONE HCL 1 MG/ML IJ SOLN
INTRAMUSCULAR | Status: AC
  Filled 2022-04-04: qty 0.5

## 2022-04-04 MED ORDER — HEPARIN SODIUM (PORCINE) 1000 UNIT/ML IJ SOLN
INTRAMUSCULAR | Status: DC | PRN
  Administered 2022-04-04: 5000 [IU] via INTRAVENOUS

## 2022-04-04 MED ORDER — OXYCODONE HCL 5 MG PO TABS
5.0000 mg | ORAL_TABLET | ORAL | Status: DC | PRN
  Administered 2022-04-04 (×2): 10 mg via ORAL
  Filled 2022-04-04 (×3): qty 2

## 2022-04-04 MED ORDER — MIDAZOLAM HCL 2 MG/2ML IJ SOLN
INTRAMUSCULAR | Status: DC | PRN
  Administered 2022-04-04 (×2): 1 mg via INTRAVENOUS

## 2022-04-04 MED ORDER — LIDOCAINE HCL (PF) 1 % IJ SOLN
INTRAMUSCULAR | Status: AC
  Filled 2022-04-04: qty 30

## 2022-04-04 MED ORDER — LIDOCAINE HCL (PF) 1 % IJ SOLN
INTRAMUSCULAR | Status: DC | PRN
  Administered 2022-04-04: 15 mL

## 2022-04-04 MED ORDER — OXYCODONE-ACETAMINOPHEN 5-325 MG PO TABS
1.0000 | ORAL_TABLET | ORAL | Status: DC | PRN
  Administered 2022-04-05 – 2022-04-06 (×6): 2 via ORAL
  Filled 2022-04-04 (×7): qty 2

## 2022-04-04 MED ORDER — AMLODIPINE BESYLATE 5 MG PO TABS
5.0000 mg | ORAL_TABLET | Freq: Every day | ORAL | Status: DC
  Administered 2022-04-05 – 2022-04-06 (×2): 5 mg via ORAL
  Filled 2022-04-04 (×2): qty 1

## 2022-04-04 MED ORDER — LABETALOL HCL 5 MG/ML IV SOLN: 10.0000 mg | INTRAVENOUS | Status: DC | PRN

## 2022-04-04 MED ORDER — GUAIFENESIN-DM 100-10 MG/5ML PO SYRP: 15.0000 mL | ORAL_SOLUTION | ORAL | Status: DC | PRN

## 2022-04-04 MED ORDER — KETOROLAC TROMETHAMINE 15 MG/ML IJ SOLN
INTRAMUSCULAR | Status: AC
  Administered 2022-04-04: 30 mg
  Filled 2022-04-04: qty 2

## 2022-04-04 MED ORDER — SODIUM CHLORIDE 0.9% FLUSH
3.0000 mL | Freq: Two times a day (BID) | INTRAVENOUS | Status: DC
  Administered 2022-04-04 – 2022-04-06 (×3): 3 mL via INTRAVENOUS

## 2022-04-04 MED ORDER — SODIUM CHLORIDE 0.9 % IV SOLN: INTRAVENOUS | Status: AC

## 2022-04-04 MED ORDER — CLOPIDOGREL BISULFATE 75 MG PO TABS: 75.0000 mg | ORAL_TABLET | Freq: Every day | ORAL | 3 refills | Status: DC

## 2022-04-04 MED ORDER — MIDAZOLAM HCL 2 MG/2ML IJ SOLN
INTRAMUSCULAR | Status: AC
  Filled 2022-04-04: qty 2

## 2022-04-04 MED ORDER — SODIUM CHLORIDE 0.9 % IV SOLN: 250.0000 mL | INTRAVENOUS | Status: DC | PRN

## 2022-04-04 MED ORDER — METOPROLOL TARTRATE 5 MG/5ML IV SOLN: 2.0000 mg | INTRAVENOUS | Status: DC | PRN

## 2022-04-04 MED ORDER — ACETAMINOPHEN 10 MG/ML IV SOLN
1000.0000 mg | Freq: Four times a day (QID) | INTRAVENOUS | Status: AC
  Administered 2022-04-04 – 2022-04-05 (×3): 1000 mg via INTRAVENOUS
  Filled 2022-04-04 (×5): qty 100

## 2022-04-04 MED ORDER — HYDRALAZINE HCL 20 MG/ML IJ SOLN: 5.0000 mg | INTRAMUSCULAR | Status: DC | PRN

## 2022-04-04 MED ORDER — POTASSIUM CHLORIDE CRYS ER 20 MEQ PO TBCR
20.0000 meq | EXTENDED_RELEASE_TABLET | Freq: Once | ORAL | Status: AC
  Administered 2022-04-04: 20 meq via ORAL
  Filled 2022-04-04: qty 1

## 2022-04-04 MED ORDER — IODIXANOL 320 MG/ML IV SOLN
INTRAVENOUS | Status: DC | PRN
  Administered 2022-04-04: 12 mL

## 2022-04-04 MED ORDER — HEPARIN (PORCINE) IN NACL 1000-0.9 UT/500ML-% IV SOLN
INTRAVENOUS | Status: DC | PRN
  Administered 2022-04-04 (×2): 500 mL

## 2022-04-04 MED ORDER — HYDROMORPHONE HCL 1 MG/ML IJ SOLN
0.5000 mg | INTRAMUSCULAR | Status: DC | PRN
  Administered 2022-04-04 – 2022-04-06 (×7): 1 mg via INTRAVENOUS
  Filled 2022-04-04 (×7): qty 1

## 2022-04-04 MED ORDER — ONDANSETRON HCL 4 MG/2ML IJ SOLN: 4.0000 mg | Freq: Four times a day (QID) | INTRAMUSCULAR | Status: DC | PRN

## 2022-04-04 MED ORDER — CLOPIDOGREL BISULFATE 300 MG PO TABS
ORAL_TABLET | ORAL | Status: AC
  Filled 2022-04-04: qty 1

## 2022-04-04 MED ORDER — PANTOPRAZOLE SODIUM 40 MG PO TBEC
40.0000 mg | DELAYED_RELEASE_TABLET | Freq: Every day | ORAL | Status: DC
  Administered 2022-04-05 – 2022-04-06 (×2): 40 mg via ORAL
  Filled 2022-04-04 (×2): qty 1

## 2022-04-04 MED ORDER — VITAMIN D 25 MCG (1000 UNIT) PO TABS
1000.0000 [IU] | ORAL_TABLET | Freq: Every day | ORAL | Status: DC
  Administered 2022-04-04 – 2022-04-06 (×3): 1000 [IU] via ORAL
  Filled 2022-04-04 (×3): qty 1

## 2022-04-04 MED ORDER — ALBUTEROL SULFATE (2.5 MG/3ML) 0.083% IN NEBU: 3.0000 mL | INHALATION_SOLUTION | Freq: Four times a day (QID) | RESPIRATORY_TRACT | Status: DC | PRN

## 2022-04-04 MED ORDER — FENTANYL CITRATE (PF) 100 MCG/2ML IJ SOLN
INTRAMUSCULAR | Status: DC | PRN
Start: 2022-04-04 — End: 2022-04-04
  Administered 2022-04-04: 25 ug via INTRAVENOUS
  Administered 2022-04-04 (×2): 50 ug via INTRAVENOUS

## 2022-04-04 MED ORDER — HEPARIN (PORCINE) IN NACL 1000-0.9 UT/500ML-% IV SOLN
INTRAVENOUS | Status: AC
  Filled 2022-04-04: qty 1000

## 2022-04-04 MED ORDER — CLOPIDOGREL BISULFATE 75 MG PO TABS
300.0000 mg | ORAL_TABLET | Freq: Once | ORAL | Status: AC
  Administered 2022-04-04: 300 mg via ORAL

## 2022-04-04 MED ORDER — CLOPIDOGREL BISULFATE 75 MG PO TABS
75.0000 mg | ORAL_TABLET | Freq: Every day | ORAL | Status: DC
  Administered 2022-04-05 – 2022-04-06 (×2): 75 mg via ORAL
  Filled 2022-04-04 (×2): qty 1

## 2022-04-04 MED ORDER — KETOROLAC TROMETHAMINE 60 MG/2ML IM SOLN
INTRAMUSCULAR | Status: DC | PRN
  Administered 2022-04-04: 60 mg via INTRA_ARTICULAR

## 2022-04-04 MED ORDER — KETOROLAC TROMETHAMINE 30 MG/ML IJ SOLN
30.0000 mg | Freq: Four times a day (QID) | INTRAMUSCULAR | Status: DC
  Administered 2022-04-04 – 2022-04-06 (×7): 30 mg via INTRAVENOUS
  Filled 2022-04-04 (×7): qty 1

## 2022-04-04 MED ORDER — HYDROMORPHONE HCL 1 MG/ML IJ SOLN
0.5000 mg | INTRAMUSCULAR | Status: DC | PRN
  Administered 2022-04-04: 0.5 mg via INTRAVENOUS

## 2022-04-04 MED ORDER — ENOXAPARIN SODIUM 40 MG/0.4ML IJ SOSY
40.0000 mg | PREFILLED_SYRINGE | INTRAMUSCULAR | Status: DC
  Administered 2022-04-04 – 2022-04-05 (×2): 40 mg via SUBCUTANEOUS
  Filled 2022-04-04 (×2): qty 0.4

## 2022-04-04 MED ORDER — ALUM & MAG HYDROXIDE-SIMETH 200-200-20 MG/5ML PO SUSP: 15.0000 mL | ORAL | Status: DC | PRN

## 2022-04-04 MED ORDER — OXYCODONE HCL 5 MG PO TABS
ORAL_TABLET | ORAL | Status: AC
  Filled 2022-04-04: qty 2

## 2022-04-04 MED ORDER — PHENOL 1.4 % MT LIQD: 1.0000 | OROMUCOSAL | Status: DC | PRN

## 2022-04-04 MED ORDER — SODIUM CHLORIDE 0.9% FLUSH: 3.0000 mL | INTRAVENOUS | Status: DC | PRN

## 2022-04-04 SURGICAL SUPPLY — 15 items
BALLN MUSTANG 12X60X75 (BALLOONS) ×3
BALLOON MUSTANG 12X60X75 (BALLOONS) IMPLANT
CATH OMNI FLUSH 5F 65CM (CATHETERS) ×1 IMPLANT
CATH VISIONS PV .035 IVUS (CATHETERS) ×1 IMPLANT
GLIDEWIRE ADV .035X260CM (WIRE) ×1 IMPLANT
KIT ENCORE 26 ADVANTAGE (KITS) ×1 IMPLANT
KIT MICROPUNCTURE NIT STIFF (SHEATH) ×1 IMPLANT
KIT PV (KITS) ×1 IMPLANT
SHEATH PINNACLE 5F 10CM (SHEATH) ×1 IMPLANT
SHEATH PINNACLE 8F 10CM (SHEATH) ×1 IMPLANT
SHEATH PINNACLE 9F 10CM (SHEATH) ×1 IMPLANT
STENT VENOUS ABRE 14X60 (Permanent Stent) ×1 IMPLANT
TRANSDUCER W/STOPCOCK (MISCELLANEOUS) ×3 IMPLANT
TRAY PV CATH (CUSTOM PROCEDURE TRAY) ×3 IMPLANT
WIRE BENTSON .035X145CM (WIRE) ×1 IMPLANT

## 2022-04-04 NOTE — Progress Notes (Signed)
Report was called to [facility] nurse on [address].  Patient's left groin remains stabe, CdI, 2 + PP.  Patient states pain level is now 5/10. Patient was transported via WC by NT.

## 2022-04-04 NOTE — Interval H&P Note (Signed)
History and Physical Interval Note:  04/04/2022 7:18 AM  Kaylee Short  has presented today for surgery, with the diagnosis of may-thurner syndrome.  The various methods of treatment have been discussed with the patient and family. After consideration of risks, benefits and other options for treatment, the patient has consented to  Procedure(s): LOWER EXTREMITY VENOGRAPHY (N/A) as a surgical intervention.  The patient's history has been reviewed, patient examined, no change in status, stable for surgery.  I have reviewed the patient's chart and labs.  Questions were answered to the patient's satisfaction.     Lemar Livings

## 2022-04-04 NOTE — Op Note (Signed)
    Patient name: Kaylee Short MRN: [id number] DOB: 08/03/1991 Sex: female  04/04/2022 Pre-operative Diagnosis: May Thurner syndrome Post-operative diagnosis:  Same Surgeon:  Erlene Quan C. Donzetta Matters, MD Procedure Performed: 1.  Ultrasound-guided cannulation left common femoral vein 2.  Central venogram 3.  Intravascular ultrasound left external iliac, left common iliac IVC 4.  Stent of left common iliac vein with 14 x 60 mm Abre 5.  Moderate sedation with fentanyl and Versed for 41 minutes   Indications: 31 year old female with chronic left lower extremity swelling with known May Thurner syndrome verified by CT venogram.  She is now indicated for central venography with intravascular ultrasound and possible stenting.  Findings: The IVC was patent.  The left common vein measures 12.2 cm peripheral from the occluded area.  There was no discernible lumen at the level of the crossing of the right common iliac artery by intravascular ultrasound.  It is actually painful for the patient to pass the intravascular ultrasound catheter initially.  At completion the lumen was 10.3 cm after postdilatation with 12 blade.   Procedure:  The patient was identified in the holding area and taken to room 8.  The patient was then placed supine on the table and prepped and draped in the usual sterile fashion.  A time out was called.  Ultrasound was used to evaluate the left common femoral vein.  The area was anesthetized with 1% lidocaine cannulated with a micropuncture needle followed by wire and sheath.  Images saved the permanent record.  Bentson wire was placed which did stop at the level of the common iliac vein confluence.  5 French sheath was placed.  We performed venogram which demonstrated reflux into the hypogastric and into the lumbar.  We then placed a Glidewire advantage and placed this up into the patient's right IJ this was marked on the drapes.  We placed an 8 French sheath perform intravascular ultrasound  which demonstrated the above findings.  We plan for primary stenting.  A 9 French sheath was placed patient was given 5000 units of heparin.  We primarily stented with a 14 x 60 mm self-expanding stent.  This was postdilated with a 12 mm balloon.  Completion demonstrated limited 10.3 cm.  This was quite painful for the patient.  We administered 60 mg of Toradol as well as additional fentanyl if she is still having issues.  The wire was removed after completion of the mass ultrasound warm and completion venography which demonstrated no further reflux into the hypogastric vein.  Plan will be to remove the sheath in postoperative holding.  Contrast: 12 cc  Elbia Paro C. Donzetta Matters, MD [facility] [place] Office: [phone] Pager: [phone]

## 2022-04-04 NOTE — Progress Notes (Signed)
SITE AREA: left groin/femoral  SITE PRIOR TO REMOVAL:  LEVEL 0  PRESSURE APPLIED FOR: approximately 10 minutes   MANUAL: yes  PATIENT STATUS DURING PULL: stable, see MAR for pain meds given  POST PULL SITE:  LEVEL 0  POST PULL INSTRUCTIONS GIVEN: yes  POST PULL PULSES PRESENT: bilateral pedal pulses at +2  DRESSING APPLIED: gauze with tegaderm   BEDREST BEGINS @ 0931  COMMENTS:

## 2022-04-05 ENCOUNTER — Encounter (HOSPITAL_COMMUNITY): Payer: Self-pay | Admitting: Vascular Surgery

## 2022-04-05 DIAGNOSIS — I871 Compression of vein: Secondary | ICD-10-CM | POA: Diagnosis not present

## 2022-04-05 LAB — GLUCOSE, CAPILLARY
Glucose-Capillary: 81 mg/dL (ref 70–99)
Glucose-Capillary: 87 mg/dL (ref 70–99)

## 2022-04-05 NOTE — Progress Notes (Addendum)
  Progress Note    04/05/2022 7:25 AM 1 Day Post-Op  Subjective:  c/o pain around the right lower back/buttock  Afebrile HR 50's-70's  [id number] systolic [id number] RA  Vitals:   04/05/22 0026 04/05/22 0424  BP: 126/87 127/73  Pulse: 66 (!) 59  Resp: 12 11  Temp: 98.2 F (36.8 C) 97.6 F (36.4 C)  SpO2: 97% 98%    Physical Exam: Cardiac:  regular Lungs:  non labored Incisions:  left groin is soft without hematoma Extremities:  easily palpable left DP pulse Abdomen:  soft  CBC    Component Value Date/Time   WBC 3.2 (L) 04/04/2022 1607   RBC 4.20 04/04/2022 1607   HGB 12.7 04/04/2022 1607   HCT 36.5 04/04/2022 1607   PLT 146 (L) 04/04/2022 1607   MCV 86.9 04/04/2022 1607   MCH 30.2 04/04/2022 1607   MCHC 34.8 04/04/2022 1607   RDW 11.9 04/04/2022 1607   LYMPHSABS 1.4 04/23/2021 0158   MONOABS 0.4 04/23/2021 0158   EOSABS 0.2 04/23/2021 0158   BASOSABS 0.1 04/23/2021 0158    BMET    Component Value Date/Time   NA 139 04/04/2022 0642   K 4.0 04/04/2022 0642   CL 104 04/04/2022 0642   CO2 26 04/23/2021 0158   GLUCOSE 101 (H) 04/04/2022 0642   BUN 10 04/04/2022 0642   CREATININE 0.60 04/04/2022 0642   CALCIUM 9.0 04/23/2021 0158   GFRNONAA >60 04/23/2021 0158   GFRAA >60 02/03/2018 2108    INR No results found for: INR   Intake/Output Summary (Last 24 hours) at 04/05/2022 0725 Last data filed at 04/04/2022 1500 Gross per 24 hour  Intake 120 ml  Output --  Net 120 ml     Assessment/Plan:  31 y.o. female is s/p:  Stenting of the left CIV 1 Day Post-Op   -pt with right sided low back/buttock pain-discussed with Dr. Donzetta Matters and pain from inflammatory response.  Pt receiving scheduled Toradol and has received IV Tylenol.  Pt pain should continue to improve.  Will most likely require another day or two in the hospital.   -mobilize today -DVT prophylaxis:  Lovenox   Leontine Locket, PA-C [facility] [phone] 04/05/2022 7:25  AM  I have independently interviewed and examined patient and agree with PA assessment and plan above.  Patient is okay to be out of bed and showering.  We will continue to work pain control with nonnarcotic medicine as possible.  She will require overnight stay for pain control at least tonight.  Keven Soucy C. Donzetta Matters, MD [facility] [place] Office: [phone] Pager: [phone]

## 2022-04-05 NOTE — Progress Notes (Addendum)
Patient requesting to take a shower. PA paged. Awaiting response.  *[phone] MD paged.  Kenard Gower, RN

## 2022-04-06 DIAGNOSIS — I871 Compression of vein: Secondary | ICD-10-CM | POA: Diagnosis not present

## 2022-04-06 MED ORDER — CLOPIDOGREL BISULFATE 75 MG PO TABS: 75.0000 mg | ORAL_TABLET | Freq: Every day | ORAL | 3 refills | Status: DC

## 2022-04-06 MED ORDER — OXYCODONE-ACETAMINOPHEN 5-325 MG PO TABS: 1.0000 | ORAL_TABLET | Freq: Four times a day (QID) | ORAL | 0 refills | Status: DC | PRN

## 2022-04-06 NOTE — Plan of Care (Signed)

## 2022-04-06 NOTE — TOC Transition Note (Signed)
Transition of Care (TOC) - CM/SW Discharge Note Donn Pierini RN, BSN Transitions of Care Unit 4E- RN Case Manager See Treatment Team for direct phone #    Patient Details  Name: Kaylee Short MRN: [id number] Date of Birth: 07-12-1991  Transition of Care [facility]) CM/SW Contact:  Darrold Span, RN Phone Number: 04/06/2022, 12:33 PM   Clinical Narrative:    Pt stable for transition home today, Transition of Care Department [facility]) has reviewed patient and no TOC needs have been identified at this time.   Final next level of care: Home/Self Care Barriers to Discharge: No Barriers Identified   Patient Goals and CMS Choice    NA    Discharge Placement           Home            Discharge Plan and Services                                     Social Determinants of Health (SDOH) Interventions     Readmission Risk Interventions     View : No data to display.

## 2022-04-06 NOTE — Progress Notes (Addendum)
     Progress Note    04/06/2022 8:01 AM 2 Days Post-Op  Subjective:  still with some pain but overall better, ready to go home   Vitals:   04/05/22 2355 04/06/22 0401  BP: 118/74 138/90  Pulse: 65 63  Resp: 18 16  Temp: 98.3 F (36.8 C) 98.1 F (36.7 C)  SpO2: 98% 96%   Physical Exam: Cardiac:  regular Lungs:  non labored Incisions:  left femoral access site c/d/I without swelling or hematoma Extremities:  well perfused and warm with palpable DP pulses Abdomen:  soft Neurologic: alert and oriented  CBC    Component Value Date/Time   WBC 3.2 (L) 04/04/2022 1607   RBC 4.20 04/04/2022 1607   HGB 12.7 04/04/2022 1607   HCT 36.5 04/04/2022 1607   PLT 146 (L) 04/04/2022 1607   MCV 86.9 04/04/2022 1607   MCH 30.2 04/04/2022 1607   MCHC 34.8 04/04/2022 1607   RDW 11.9 04/04/2022 1607   LYMPHSABS 1.4 04/23/2021 0158   MONOABS 0.4 04/23/2021 0158   EOSABS 0.2 04/23/2021 0158   BASOSABS 0.1 04/23/2021 0158    BMET    Component Value Date/Time   NA 139 04/04/2022 0642   K 4.0 04/04/2022 0642   CL 104 04/04/2022 0642   CO2 26 04/23/2021 0158   GLUCOSE 101 (H) 04/04/2022 0642   BUN 10 04/04/2022 0642   CREATININE 0.60 04/04/2022 0642   CALCIUM 9.0 04/23/2021 0158   GFRNONAA >60 04/23/2021 0158   GFRAA >60 02/03/2018 2108    INR No results found for: INR   Intake/Output Summary (Last 24 hours) at 04/06/2022 0801 Last data filed at 04/05/2022 2000 Gross per 24 hour  Intake 2537.32 ml  Output --  Net 2537.32 ml     Assessment/Plan:  31 y.o. female is s/p Stenting left CIV 2 Days Post-Op   Pain better controlled Order placed to thigh high compression Stable for discharge home today Follow up has been arranged on 05/18/22   Karoline Caldwell, PA-C [facility] [phone] 04/06/2022 8:01 AM   I have interviewed and examined patient with PA and agree with assessment and plan above.   Willowdean Luhmann C. Donzetta Matters, MD [facility]  [facility] [place] Office: [phone] Pager: [phone]

## 2022-04-06 NOTE — Progress Notes (Signed)
  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 She underwent left common femoral vein stenting on 04/04/22. She may return to work on 04/07/22 with light duty. Recommend no heavy lifting, pushing, pulling > 10 lbs for 4 weeks     Graceann Congress, [place] [facility] [phone] 04/06/2022 8:08 AM

## 2022-04-06 NOTE — Discharge Summary (Signed)
  Discharge Summary  Patient ID: Kaylee Short [id number] 31 y.o. 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  She is now indicated for central venography with intravascular ultrasound and possible stenting  Hospital Course: Mrs. Hollars was admitted on 04/04/22 and underwent ultrasound guided cannulation of left common femoral vein, central venogram, intravascular ultrasound of left external iliac, left common iliac, IVC with stenting of the left common iliac vein by Dr. Donzetta Matters. Overall she tolerated the procedure well. She was taken to the recovery room instable condition  By POD #1, significant pain in right lower back and buttock. Left groin access site without hematoma or swelling. Left lower extremity well perfused and warm with palpable DP pulse.  Receiving scheduled Toradol and IV tylenol as well as hydrocodone for pain control. Encouraged mobilization.   By POD#2, Pain better controlled. Patient comfortable and stable for discharge home. Thigh high compression stocking ordered for patient. Left leg remained well perfused and warm. Access site without swelling or hematoma. PDMP reviewed and acute post operative pain medication sent to patients pharmacy. Follow up arranged on 05/18/22  Consults: None  Treatments: analgesia: acetaminophen and Toradol, Oxycodone and procedures:   Disposition: Discharge disposition: 01-Home or Self Care       Patient Instructions:  Allergies as of 04/06/2022   No Known Allergies      Medication List      TAKE these medications    albuterol 108 (90 Base) MCG/ACT inhaler Commonly known as: VENTOLIN HFA Inhale 1-2 puffs into the lungs every 6 (six) hours as needed for wheezing or shortness of breath.   amLODipine 5 MG tablet Commonly known as: NORVASC Take 5 mg by mouth daily. Notes to patient: Start on 04/07/22   cholecalciferol 25 MCG (1000 UNIT) tablet Commonly known as: VITAMIN D Take 1,000 Units by mouth daily. Notes to patient: Start on 04/07/22   clopidogrel 75 MG tablet Commonly known as: Plavix Take 1 tablet (75 mg total) by mouth daily. Notes to patient: Start on 04/07/22   hydrocortisone cream 1 % Apply 1 application. topically daily as needed for itching (Eczema).   ibuprofen 600 MG tablet Commonly known as: ADVIL Take 1 tablet (600 mg total) by mouth every 6 (six) hours as needed.   oxyCODONE-acetaminophen 5-325 MG tablet Commonly known as: PERCOCET/ROXICET Take 1 tablet by mouth every 6 (six) hours as needed for moderate pain.   Suboxone 8-2 MG Film Generic drug: Buprenorphine HCl-Naloxone HCl Dissolve 1 film under tongue twice daily and 1/2 of film under tongue before bedtime. What changed:  how much to take how to take this when to take this       Activity: activity as tolerated, no driving while on analgesics, and no heavy lifting for 4 weeks Diet: regular diet Wound Care:  wash groin access with mild soap and water, pat dry  Follow-up with Dr. Donzetta Matters in 5 weeks.  SignedKaroline Caldwell, PA-C 04/08/2022 2:04 PM [facility] Office: [phone]

## 2022-04-06 NOTE — Progress Notes (Signed)
Discharge instructions (including medications) discussed with and copy provided to patient/caregiver 

## 2022-04-25 ENCOUNTER — Other Ambulatory Visit: Payer: Self-pay

## 2022-04-25 ENCOUNTER — Emergency Department (HOSPITAL_COMMUNITY)
Admission: EM | Admit: 2022-04-25 | Discharge: 2022-04-26 | Disposition: A | Discharge status: Home / Self Care | Payer: Medicaid Other | Attending: Emergency Medicine | Admitting: Emergency Medicine

## 2022-04-25 DIAGNOSIS — Z79899 Other long term (current) drug therapy: Secondary | ICD-10-CM | POA: Diagnosis not present

## 2022-04-25 DIAGNOSIS — M545 Low back pain, unspecified: Secondary | ICD-10-CM | POA: Diagnosis present

## 2022-04-25 DIAGNOSIS — R1031 Right lower quadrant pain: Secondary | ICD-10-CM

## 2022-04-25 NOTE — ED Triage Notes (Signed)
Pt c/o increased right groin pain and states she felt like she was going to pass out.

## 2022-04-26 ENCOUNTER — Emergency Department (HOSPITAL_COMMUNITY): Payer: Medicaid Other

## 2022-04-26 LAB — COMPREHENSIVE METABOLIC PANEL
ALT: 15 U/L (ref 0–44)
AST: 19 U/L (ref 15–41)
Albumin: 4.1 g/dL (ref 3.5–5.0)
Alkaline Phosphatase: 55 U/L (ref 38–126)
Anion gap: 6 (ref 5–15)
BUN: 11 mg/dL (ref 6–20)
CO2: 26 mmol/L (ref 22–32)
Calcium: 8.8 mg/dL — ABNORMAL LOW (ref 8.9–10.3)
Chloride: 104 mmol/L (ref 98–111)
Creatinine, Ser: 0.74 mg/dL (ref 0.44–1.00)
GFR, Estimated: >60 mL/min (ref >60)
Glucose, Bld: 100 mg/dL — ABNORMAL HIGH (ref 70–99)
Potassium: 3.8 mmol/L (ref 3.5–5.1)
Sodium: 136 mmol/L (ref 135–145)
Total Bilirubin: 0.6 mg/dL (ref 0.3–1.2)
Total Protein: 7.2 g/dL (ref 6.5–8.1)

## 2022-04-26 LAB — CBC WITH DIFFERENTIAL/PLATELET
Abs Immature Granulocytes: 0.01 10*3/uL (ref 0.00–0.07)
Basophils Absolute: 0 10*3/uL (ref 0.0–0.1)
Basophils Relative: 1 %
Eosinophils Absolute: 0.1 10*3/uL (ref 0.0–0.5)
Eosinophils Relative: 3 %
HCT: 35.3 % — ABNORMAL LOW (ref 36.0–46.0)
Hemoglobin: 11.9 g/dL — ABNORMAL LOW (ref 12.0–15.0)
Immature Granulocytes: 0 %
Lymphocytes Relative: 26 %
Lymphs Abs: 1 10*3/uL (ref 0.7–4.0)
MCH: 29.5 pg (ref 26.0–34.0)
MCHC: 33.7 g/dL (ref 30.0–36.0)
MCV: 87.4 fL (ref 80.0–100.0)
Monocytes Absolute: 0.4 10*3/uL (ref 0.1–1.0)
Monocytes Relative: 9 %
Neutro Abs: 2.3 10*3/uL (ref 1.7–7.7)
Neutrophils Relative %: 61 %
Platelets: 146 10*3/uL — ABNORMAL LOW (ref 150–400)
RBC: 4.04 MIL/uL (ref 3.87–5.11)
RDW: 11.8 % (ref 11.5–15.5)
WBC: 3.8 10*3/uL — ABNORMAL LOW (ref 4.0–10.5)
nRBC: 0 % (ref 0.0–0.2)

## 2022-04-26 LAB — URINALYSIS, ROUTINE W REFLEX MICROSCOPIC
Bilirubin Urine: NEGATIVE
Glucose, UA: NEGATIVE mg/dL
Ketones, ur: NEGATIVE mg/dL
Leukocytes,Ua: NEGATIVE
Nitrite: NEGATIVE
Protein, ur: NEGATIVE mg/dL
Specific Gravity, Urine: 1.013 (ref 1.005–1.030)
pH: 6 (ref 5.0–8.0)

## 2022-04-26 LAB — PREGNANCY, URINE: Preg Test, Ur: NEGATIVE

## 2022-04-26 LAB — MAGNESIUM: Magnesium: 1.9 mg/dL (ref 1.7–2.4)

## 2022-04-26 MED ORDER — IOHEXOL 350 MG/ML SOLN
100.0000 mL | Freq: Once | INTRAVENOUS | Status: AC | PRN
  Administered 2022-04-26: 100 mL via INTRAVENOUS

## 2022-04-26 NOTE — ED Provider Notes (Signed)
[facility] EMERGENCY DEPARTMENT Provider Note   CSN: [id number] Arrival date & time: 04/25/22  2257     History  Chief Complaint  Patient presents with   Groin Pain    Kaylee Short is a 31 y.o. female.  32 year old female who recently had a left iliac stent placed secondary to May Turner syndrome the presents the ER today with right-sided groin pain.  Patient states that it radiates up into her right lower abdomen and also having some pain in her right lower back.  States is worse somewhat with movement.  No urinary changes.  No GI changes.  She states when she stood up at 1 point she did feel like she had some swelling to her right groin but she does not feel that now.  At this time she is asymptomatic.  She also had an episode where she was sitting there watching TV and she felt flushed all over and felt like she might pass out.  Said no shortness of breath, chest pain, headache or other associated changes.  This episode lasted about 10 to 15 seconds total.  Never anything like it before.   Groin Pain       Home Medications Prior to Admission medications   Medication Sig Start Date End Date Taking? Authorizing Provider  albuterol (PROVENTIL HFA;VENTOLIN HFA) 108 (90 Base) MCG/ACT inhaler Inhale 1-2 puffs into the lungs every 6 (six) hours as needed for wheezing or shortness of breath. 02/19/16   Elson Areas, PA-C  amLODipine (NORVASC) 5 MG tablet Take 5 mg by mouth daily. 01/01/18 04/04/22  [provider]  cholecalciferol (VITAMIN D) 25 MCG (1000 UNIT) tablet Take 1,000 Units by mouth daily.    [provider]  clopidogrel (PLAVIX) 75 MG tablet Take 1 tablet (75 mg total) by mouth daily. 04/06/22   Baglia, Corrina, PA-C  hydrocortisone cream 1 % Apply 1 application. topically daily as needed for itching (Eczema).    [provider]  ibuprofen (ADVIL) 600 MG tablet Take 1 tablet (600 mg total) by mouth every 6 (six) hours as needed. 03/26/21   Lorelee New, PA-C  oxyCODONE-acetaminophen (PERCOCET/ROXICET) 5-325 MG tablet Take 1 tablet by mouth every 6 (six) hours as needed for moderate pain. 04/06/22   Baglia, Corrina, PA-C  SUBOXONE 8-2 MG FILM Dissolve 1 film under tongue twice daily and 1/2 of film under tongue before bedtime. Patient taking differently: Take 8.2 Film by mouth in the morning, at noon, and at bedtime. 09/08/21         Allergies    Patient has no known allergies.    Review of Systems   Review of Systems  Physical Exam Updated Vital Signs BP 124/87   Pulse 65   Temp 98 F (36.7 C) (Oral)   Resp 19   Ht 5' 7.5" (1.715 m)   Wt 93 kg   LMP 04/17/2022   SpO2 98%   BMI 31.63 kg/m  Physical Exam Vitals and nursing note reviewed.  Constitutional:      Appearance: She is well-developed.  HENT:     Head: Normocephalic and atraumatic.     Mouth/Throat:     Mouth: Mucous membranes are moist.     Pharynx: Oropharynx is clear.  Eyes:     Pupils: Pupils are equal, round, and reactive to light.  Cardiovascular:     Rate and Rhythm: Normal rate and regular rhythm.  Pulmonary:     Effort: No respiratory distress.  Breath sounds: No stridor.  Abdominal:     General: Abdomen is flat. There is no distension.  Musculoskeletal:        General: Tenderness (Mild to right groin, right lower back, right lumbar paraspinal) present. Normal range of motion.     Cervical back: Normal range of motion.  Skin:    General: Skin is warm and dry.  Neurological:     General: No focal deficit present.     Mental Status: She is alert.     ED Results / Procedures / Treatments   Labs (all labs ordered are listed, but only abnormal results are displayed) Labs Reviewed  CBC WITH DIFFERENTIAL/PLATELET - Abnormal; Notable for the following components:      Result Value   WBC 3.8 (*)    Hemoglobin 11.9 (*)    HCT 35.3 (*)    Platelets 146 (*)    All other components within normal limits  COMPREHENSIVE METABOLIC PANEL -  Abnormal; Notable for the following components:   Glucose, Bld 100 (*)    Calcium 8.8 (*)    All other components within normal limits  URINALYSIS, ROUTINE W REFLEX MICROSCOPIC - Abnormal; Notable for the following components:   APPearance HAZY (*)    Hgb urine dipstick SMALL (*)    Bacteria, UA RARE (*)    All other components within normal limits  URINE CULTURE  MAGNESIUM  PREGNANCY, URINE    EKG EKG Interpretation  Date/Time:  Monday April 25 2022 23:55:04 EDT Ventricular Rate:  76 PR Interval:  163 QRS Duration: 102 QT Interval:  390 QTC Calculation: 439 R Axis:   48 Text Interpretation: Sinus rhythm Probable left atrial enlargement RSR' in V1 or V2, probably normal variant Confirmed by Marily Memos [phone]) on 04/25/2022 11:56:39 PM  Radiology CT L-SPINE NO CHARGE  Result Date: 04/26/2022 CLINICAL DATA:  Low back pain. EXAM: CT LUMBAR SPINE WITHOUT CONTRAST TECHNIQUE: Multidetector CT imaging of the lumbar spine was performed without intravenous contrast administration. Multiplanar CT image reconstructions were also generated. RADIATION DOSE REDUCTION: This exam was performed according to the departmental dose-optimization program which includes automated exposure control, adjustment of the mA and/or kV according to patient size and/or use of iterative reconstruction technique. COMPARISON:  CT abdomen pelvis dated 04/23/2021. FINDINGS: Segmentation: 5 lumbar type vertebrae. Alignment: Normal. Vertebrae: No acute fracture or focal pathologic process. Paraspinal and other soft tissues: Negative. Disc levels: No acute findings. No significant degenerative changes. Minimal (grade 1) L5-S1 retrolisthesis. IMPRESSION: No acute lumbar spine pathology.  No degenerative changes. Electronically Signed   By: Elgie Collard M.D.   On: 04/26/2022 02:41   CT Angio Abd/Pel W and/or Wo Contrast  Result Date: 04/26/2022 CLINICAL DATA:  Low back pain and groin pain.  Concern for aneurysm. EXAM:  CTA ABDOMEN AND PELVIS WITHOUT AND WITH CONTRAST TECHNIQUE: Multidetector CT imaging of the abdomen and pelvis was performed using the standard protocol during bolus administration of intravenous contrast. Multiplanar reconstructed images and MIPs were obtained and reviewed to evaluate the vascular anatomy. RADIATION DOSE REDUCTION: This exam was performed according to the departmental dose-optimization program which includes automated exposure control, adjustment of the mA and/or kV according to patient size and/or use of iterative reconstruction technique. CONTRAST:  OMNIPAQUE IOHEXOL 350 MG/ML SOLN COMPARISON:  Abdominal ultrasound dated 04/23/2021 and CT dated 04/23/2021. FINDINGS: VASCULAR Aorta: Normal caliber aorta without aneurysm, dissection, vasculitis or significant stenosis. Celiac: Patent without evidence of aneurysm, dissection, vasculitis or significant stenosis. SMA: Patent  without evidence of aneurysm, dissection, vasculitis or significant stenosis. Renals: Both renal arteries are patent without evidence of aneurysm, dissection, vasculitis, fibromuscular dysplasia or significant stenosis. IMA: Patent without evidence of aneurysm, dissection, vasculitis or significant stenosis. Inflow: Patent without evidence of aneurysm, dissection, vasculitis or significant stenosis. Proximal Outflow: Bilateral common femoral and visualized portions of the superficial and profunda femoral arteries are patent without evidence of aneurysm, dissection, vasculitis or significant stenosis. Veins: An endovascular stent noted in the left common iliac vein. Evaluation of the stent is limited due to timing of the contrast and nonopacification of the veins. The IVC is grossly unremarkable. No portal venous gas. Review of the MIP images confirms the above findings. NON-VASCULAR Lower chest: The visualized lung bases are clear. No intra-abdominal free air or free fluid. Hepatobiliary: The liver is unremarkable. No  intrahepatic biliary dilatation. The gallbladder is unremarkable. Pancreas: Unremarkable. No pancreatic ductal dilatation or surrounding inflammatory changes. Spleen: Normal in size without focal abnormality. Adrenals/Urinary Tract: The adrenal glands unremarkable. The kidneys, visualized ureters, and urinary bladder appear unremarkable. Stomach/Bowel: There is no bowel obstruction or active inflammation. The appendix is normal. Lymphatic: No adenopathy. Reproductive: The uterus is anteverted and grossly unremarkable. Probable 2 cm right ovarian corpus luteum. Other: None Musculoskeletal: No acute or significant osseous findings. IMPRESSION: 1. No acute intra-abdominal or pelvic pathology. No aortic aneurysm or dissection. 2. Endovascular stent in the left common iliac vein. Electronically Signed   By: Elgie CollardArash  Radparvar M.D.   On: 04/26/2022 02:38    Procedures Procedures    Medications Ordered in ED Medications  iohexol (OMNIPAQUE) 350 MG/ML injection 100 mL (100 mLs Intravenous Contrast Given 04/26/22 0128)    ED Course/ Medical Decision Making/ A&P                           Medical Decision Making Amount and/or Complexity of Data Reviewed Labs: ordered. Radiology: ordered. ECG/medicine tests: ordered.  Risk Prescription drug management.   Work-up unremarkable.  Feel better.  No concerning features for her near syncopal episode either.  CT scan without any complications there to include spinal pathology, kidney stone, vascular pathology.  Patient stable for discharge in good condition   Final Clinical Impression(s) / ED Diagnoses Final diagnoses:  Right inguinal pain    Rx / DC Orders ED Discharge Orders     None         Krisalyn Yankowski, Barbara CowerJason, MD 04/26/22 [phone]

## 2022-04-26 NOTE — ED Notes (Signed)
Patient transported to CT 

## 2022-04-26 NOTE — ED Notes (Signed)
Patient returned to room from CT. 

## 2022-04-27 LAB — URINE CULTURE: Culture: NO GROWTH

## 2022-05-06 ENCOUNTER — Other Ambulatory Visit: Payer: Self-pay | Admitting: *Deleted

## 2022-05-06 DIAGNOSIS — I871 Compression of vein: Secondary | ICD-10-CM

## 2022-05-10 NOTE — Progress Notes (Deleted)
HISTORY AND PHYSICAL     CC:  follow up. Requesting Provider:  [facility]  HPI: This is a 31 y.o. female who is here today for follow up after stenting of the left CIV on 04/04/2022 by Dr. Randie Heinz.    Pt was initially seen on 02/09/2022 for leg swelling that had worsened over several months.  She did report that she had severe swelling with left greater than right during her pregnancies.  There was some concern for May Thurner and she was sent for a CT venogram.   She then underwent central venogram with stenting of the left CIV on 04/04/2022 by Dr. Randie Heinz.  There was no discernible lumen at the level of the crossing of the right CIA by intravascular u/s.  It was painful for the pt to pass the intravascular u/s catheter initially.  At completion, the lumen was 10.3cm.  Post operatively, she did have right sided low back and buttock pain and it was felt to be an inflammatory response.  She was in the hospital for a couple of days for pain control.  She was placed in thigh high compression.    She was seen in the ER on 04/26/2022 for right sided groin pain that radiated up into her right lower abdomen as well as her back.  She did have a CT scan and there were no complications to include spinal pathology, kidney stone, or vascular pathology and she was discharged home.   The pt returns today for follow up.  ***  The pt is not on a statin for cholesterol management.    The pt is not on an aspirin.    Other AC:  Plavix The pt is on CCB for hypertension.  The pt does not have diabetes. Tobacco hx:  former  Pt does not have family hx of AAA.  Past Medical History:  Diagnosis Date   Asthma    Bronchitis    Carpal tunnel syndrome    Bilateral   Hypertension    Tennis elbow    bilateral    Past Surgical History:  Procedure Laterality Date   CESAREAN SECTION     IVC VENOGRAPHY N/A 04/04/2022   Procedure: IVC Venography;  Surgeon: Maeola Harman, MD;  Location: [facility] INVASIVE  CV LAB;  Service: Cardiovascular;  Laterality: N/A;   LOWER EXTREMITY VENOGRAPHY N/A 04/04/2022   Procedure: LOWER EXTREMITY VENOGRAPHY;  Surgeon: Maeola Harman, MD;  Location: [facility] INVASIVE CV LAB;  Service: Cardiovascular;  Laterality: N/A;   PERIPHERAL VASCULAR INTERVENTION Left 04/04/2022   Procedure: PERIPHERAL VASCULAR INTERVENTION;  Surgeon: Maeola Harman, MD;  Location: [facility] INVASIVE CV LAB;  Service: Cardiovascular;  Laterality: Left;  COMMON VENO    No Known Allergies  Current Outpatient Medications  Medication Sig Dispense Refill   albuterol (PROVENTIL HFA;VENTOLIN HFA) 108 (90 Base) MCG/ACT inhaler Inhale 1-2 puffs into the lungs every 6 (six) hours as needed for wheezing or shortness of breath. 1 Inhaler 0   amLODipine (NORVASC) 5 MG tablet Take 5 mg by mouth daily.     cholecalciferol (VITAMIN D) 25 MCG (1000 UNIT) tablet Take 1,000 Units by mouth daily.     clopidogrel (PLAVIX) 75 MG tablet Take 1 tablet (75 mg total) by mouth daily. 30 tablet 3   hydrocortisone cream 1 % Apply 1 application. topically daily as needed for itching (Eczema).     ibuprofen (ADVIL) 600 MG tablet Take 1 tablet (600 mg total) by mouth every 6 (six) hours  as needed. 30 tablet 0   oxyCODONE-acetaminophen (PERCOCET/ROXICET) 5-325 MG tablet Take 1 tablet by mouth every 6 (six) hours as needed for moderate pain. 20 tablet 0   SUBOXONE 8-2 MG FILM Dissolve 1 film under tongue twice daily and 1/2 of film under tongue before bedtime. (Patient taking differently: Take 8.2 Film by mouth in the morning, at noon, and at bedtime.) 75 each 0   No current facility-administered medications for this visit.    No family history on file.  Social History   Socioeconomic History   Marital status: Single    Spouse name: Not on file   Number of children: Not on file   Years of education: Not on file   Highest education level: Not on file  Occupational History   Not on file  Tobacco Use    Smoking status: Former    Packs/day: 1.00    Types: Cigarettes    Quit date: 01/22/2021    Years since quitting: 1.2   Smokeless tobacco: Never  Vaping Use   Vaping Use: Never used  Substance and Sexual Activity   Alcohol use: No   Drug use: No   Sexual activity: Yes    Birth control/protection: Pill, None  Other Topics Concern   Not on file  Social History Narrative   Not on file   Social Determinants of Health   Financial Resource Strain: Not on file  Food Insecurity: Not on file  Transportation Needs: Not on file  Physical Activity: Not on file  Stress: Not on file  Social Connections: Not on file  Intimate Partner Violence: Not on file     REVIEW OF SYSTEMS:  *** [X]  denotes positive finding, [ ]  denotes negative finding Cardiac  Comments:  Chest pain or chest pressure:    Shortness of breath upon exertion:    Short of breath when lying flat:    Irregular heart rhythm:        Vascular    Pain in calf, thigh, or hip brought on by ambulation:    Pain in feet at night that wakes you up from your sleep:     Blood clot in your veins:    Leg swelling:         Pulmonary    Oxygen at home:    Productive cough:     Wheezing:         Neurologic    Sudden weakness in arms or legs:     Sudden numbness in arms or legs:     Sudden onset of difficulty speaking or slurred speech:    Temporary loss of vision in one eye:     Problems with dizziness:         Gastrointestinal    Blood in stool:     Vomited blood:         Genitourinary    Burning when urinating:     Blood in urine:        Psychiatric    Major depression:         Hematologic    Bleeding problems:    Problems with blood clotting too easily:        Skin    Rashes or ulcers:        Constitutional    Fever or chills:      PHYSICAL EXAMINATION:  ***  General:  WDWN in NAD; vital signs documented above Gait: Not observed HENT: WNL, normocephalic Pulmonary: normal non-labored breathing ,  without  wheezing Cardiac: {Desc; regular/irreg:14544} HR, {With/Without:20273} carotid bruit*** Abdomen: soft, NT, no masses; aortic pulse is *** palpable Skin: {With/Without:20273} rashes Vascular Exam/Pulses:  Right Left  Radial {Exam; arterial pulse strength 0-4:30167} {Exam; arterial pulse strength 0-4:30167}  Femoral {Exam; arterial pulse strength 0-4:30167} {Exam; arterial pulse strength 0-4:30167}  Popliteal {Exam; arterial pulse strength 0-4:30167} {Exam; arterial pulse strength 0-4:30167}  DP {Exam; arterial pulse strength 0-4:30167} {Exam; arterial pulse strength 0-4:30167}  PT {Exam; arterial pulse strength 0-4:30167} {Exam; arterial pulse strength 0-4:30167}  Peroneal *** ***   Extremities: {With/Without:20273} ischemic changes, {With/Without:20273} Gangrene , {With/Without:20273} cellulitis; {With/Without:20273} open wounds Musculoskeletal: no muscle wasting or atrophy  Neurologic: A&O X 3 Psychiatric:  The pt has {Desc; normal/abnormal:11317::"Normal"} affect.   Non-Invasive Vascular Imaging:   Venous duplex on 05/18/2022: ***    ASSESSMENT/PLAN:: 31 y.o. female here for follow up for May Thurner with hx of central venogram with stenting of the left CIV on 04/04/2022 by Dr. Randie Heinz  -*** -continue *** -pt will f/u in *** with ***.   Doreatha Massed, [facility] [phone]  Clinic MD:   Randie Heinz

## 2022-05-18 ENCOUNTER — Encounter (HOSPITAL_COMMUNITY): Payer: Medicaid Other

## 2022-05-23 ENCOUNTER — Ambulatory Visit (HOSPITAL_COMMUNITY)
Admission: RE | Admit: 2022-05-23 | Discharge: 2022-05-23 | Disposition: A | Discharge status: Home / Self Care | Payer: Medicaid Other | Source: Ambulatory Visit | Attending: Vascular Surgery | Admitting: Vascular Surgery

## 2022-05-23 DIAGNOSIS — I871 Compression of vein: Secondary | ICD-10-CM | POA: Diagnosis not present

## 2022-05-25 ENCOUNTER — Ambulatory Visit (INDEPENDENT_AMBULATORY_CARE_PROVIDER_SITE_OTHER): Payer: Medicaid Other | Admitting: Physician Assistant

## 2022-05-25 VITALS — BP 140/94 | HR 89 | Temp 98.5°F | Resp 20 | Ht 67.5 in | Wt 208.4 lb

## 2022-05-25 DIAGNOSIS — I871 Compression of vein: Secondary | ICD-10-CM

## 2022-05-25 NOTE — Progress Notes (Signed)
Office Note     CC:  follow up Requesting Provider:  [facility]  HPI: Kaylee Short is a 31 y.o. (14-Oct-1991) female who presents status post stenting of left common iliac vein by Dr. 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  Patient states the left leg edema has improved drastically since the procedure.  She does have some swelling around her ankle especially at the end of the day however this is tolerable.  She has not worn her thigh-high compression that were issued at the hospital.  She is on Plavix daily.   Past Medical History:  Diagnosis Date   Asthma    Bronchitis    Carpal tunnel syndrome    Bilateral   Hypertension    Tennis elbow    bilateral    Past Surgical History:  Procedure Laterality Date   CESAREAN SECTION     IVC VENOGRAPHY N/A 04/04/2022   Procedure: IVC Venography;  Surgeon: Maeola Harman, MD;  Location: [facility] INVASIVE CV LAB;  Service: Cardiovascular;  Laterality: N/A;   LOWER EXTREMITY VENOGRAPHY N/A 04/04/2022   Procedure: LOWER EXTREMITY VENOGRAPHY;  Surgeon: Maeola Harman, MD;  Location: [facility] INVASIVE CV LAB;  Service: Cardiovascular;  Laterality: N/A;   PERIPHERAL VASCULAR INTERVENTION Left 04/04/2022   Procedure: PERIPHERAL VASCULAR INTERVENTION;  Surgeon: Maeola Harman, MD;  Location: [facility] INVASIVE CV LAB;  Service: Cardiovascular;  Laterality: Left;  COMMON VENO    Social History   Socioeconomic History   Marital status: Single    Spouse name: Not on file   Number of children: Not on file   Years of education: Not on file   Highest education level: Not on file  Occupational History   Not on file  Tobacco Use   Smoking status: Every Day    Packs/day: 1.00    Types: E-cigarettes, Cigarettes    Last attempt to quit: 01/22/2021    Years since quitting: 1.3    Passive exposure: Never   Smokeless tobacco: Never  Vaping Use   Vaping Use: Never used  Substance and Sexual Activity   Alcohol use: No    Drug use: No   Sexual activity: Yes    Birth control/protection: Pill, None  Other Topics Concern   Not on file  Social History Narrative   Not on file   Social Determinants of Health   Financial Resource Strain: Not on file  Food Insecurity: Not on file  Transportation Needs: Not on file  Physical Activity: Not on file  Stress: Not on file  Social Connections: Not on file  Intimate Partner Violence: Not on file   History reviewed. No pertinent family history.  Current Outpatient Medications  Medication Sig Dispense Refill   albuterol (PROVENTIL HFA;VENTOLIN HFA) 108 (90 Base) MCG/ACT inhaler Inhale 1-2 puffs into the lungs every 6 (six) hours as needed for wheezing or shortness of breath. 1 Inhaler 0   amLODipine (NORVASC) 5 MG tablet Take 5 mg by mouth daily.     cholecalciferol (VITAMIN D) 25 MCG (1000 UNIT) tablet Take 1,000 Units by mouth daily.     clopidogrel (PLAVIX) 75 MG tablet Take 1 tablet (75 mg total) by mouth daily. 30 tablet 3   hydrocortisone cream 1 % Apply 1 application. topically daily as needed for itching (Eczema).     SUBOXONE 8-2 MG FILM Dissolve 1 film under tongue twice daily and 1/2 of film under tongue before bedtime. (Patient taking differently: Take 8.2 Film by mouth in the  morning, at noon, and at bedtime.) 75 each 0   No current facility-administered medications for this visit.    No Known Allergies   REVIEW OF SYSTEMS:   [X]  denotes positive finding, [ ]  denotes negative finding Cardiac  Comments:  Chest pain or chest pressure:    Shortness of breath upon exertion:    Short of breath when lying flat:    Irregular heart rhythm:        Vascular    Pain in calf, thigh, or hip brought on by ambulation:    Pain in feet at night that wakes you up from your sleep:     Blood clot in your veins:    Leg swelling:         Pulmonary    Oxygen at home:    Productive cough:     Wheezing:         Neurologic    Sudden weakness in arms or  legs:     Sudden numbness in arms or legs:     Sudden onset of difficulty speaking or slurred speech:    Temporary loss of vision in one eye:     Problems with dizziness:         Gastrointestinal    Blood in stool:     Vomited blood:         Genitourinary    Burning when urinating:     Blood in urine:        Psychiatric    Major depression:         Hematologic    Bleeding problems:    Problems with blood clotting too easily:        Skin    Rashes or ulcers:        Constitutional    Fever or chills:      PHYSICAL EXAMINATION:  Vitals:   05/25/22 0928  BP: (!) 140/94  Pulse: 89  Resp: 20  Temp: 98.5 F (36.9 C)  TempSrc: Temporal  SpO2: 97%  Weight: 208 lb 6.4 oz (94.5 kg)  Height: 5' 7.5" (1.715 m)    General:  WDWN in NAD; vital signs documented above Gait: Not observed HENT: WNL, normocephalic Pulmonary: normal non-labored breathing , without Rales, rhonchi,  wheezing Cardiac: regular HR Abdomen: soft, NT, no masses Skin: without rashes Vascular Exam/Pulses:  Right Left  DP 2+ (normal) 2+ (normal)   Extremities: No significant edema left compared to right leg Musculoskeletal: no muscle wasting or atrophy  Neurologic: A&O X 3;  No focal weakness or paresthesias are detected Psychiatric:  The pt has Normal affect.   Non-Invasive Vascular Imaging:   Patent IVC Patent left common iliac vein stent    ASSESSMENT/PLAN:: 31 y.o. female status post left common iliac vein stenting due to May Thurner syndrome   -Subjectively, edema of left leg is much improved since her procedure -Duplex demonstrates a widely patent left common iliac vein stent as well as a patent IVC -Continue Plavix for an additional refill then can switch to baby aspirin 81 mg daily -Repeat IVC/iliac venous duplex in 1 year   34, PA-C [facility] [phone]  Clinic MD:   Emilie Rutter

## 2022-10-14 ENCOUNTER — Encounter (HOSPITAL_COMMUNITY): Payer: Self-pay | Admitting: *Deleted

## 2022-10-14 ENCOUNTER — Other Ambulatory Visit: Payer: Self-pay

## 2022-10-14 ENCOUNTER — Emergency Department (HOSPITAL_COMMUNITY)
Admission: EM | Admit: 2022-10-14 | Discharge: 2022-10-14 | Disposition: A | Discharge status: Home / Self Care | Payer: Medicaid Other | Attending: Emergency Medicine | Admitting: Emergency Medicine

## 2022-10-14 DIAGNOSIS — S61211A Laceration without foreign body of left index finger without damage to nail, initial encounter: Secondary | ICD-10-CM | POA: Insufficient documentation

## 2022-10-14 DIAGNOSIS — Y9389 Activity, other specified: Secondary | ICD-10-CM | POA: Diagnosis not present

## 2022-10-14 DIAGNOSIS — Y999 Unspecified external cause status: Secondary | ICD-10-CM | POA: Insufficient documentation

## 2022-10-14 DIAGNOSIS — W260XXA Contact with knife, initial encounter: Secondary | ICD-10-CM | POA: Insufficient documentation

## 2022-10-14 DIAGNOSIS — Y929 Unspecified place or not applicable: Secondary | ICD-10-CM | POA: Insufficient documentation

## 2022-10-14 MED ORDER — BACITRACIN ZINC 500 UNIT/GM EX OINT
TOPICAL_OINTMENT | Freq: Two times a day (BID) | CUTANEOUS | Status: DC
Start: 2022-10-14 — End: 2022-10-15
  Administered 2022-10-14: 1 via TOPICAL
  Filled 2022-10-14: qty 0.9

## 2022-10-14 MED ORDER — POVIDONE-IODINE 10 % EX OINT
TOPICAL_OINTMENT | Freq: Once | CUTANEOUS | Status: DC
  Filled 2022-10-14: qty 28.35

## 2022-10-14 MED ORDER — POVIDONE-IODINE 10 % EX SOLN
CUTANEOUS | Status: AC
  Administered 2022-10-14: 1
  Filled 2022-10-14: qty 14.8

## 2022-10-14 NOTE — Discharge Instructions (Addendum)
This wound will heal all by itself, just keep the wound clean and dry for 48 hours.  After that you may take the finger splint off and use gentle warm soapy water to clean, apply a topical antibiotic and a sterile dressing.  This should go back to normal within 10 to 14 days and you should be able to use your finger within 1 week.  You may want to keep a Band-Aid over this for the full 2 weeks.  Return to the ER for severe worsening bleeding pain swelling fever pus numbness or spreading redness

## 2022-10-14 NOTE — ED Notes (Signed)
ED Provider at bedside. 

## 2022-10-14 NOTE — ED Provider Notes (Signed)
[facility] EMERGENCY DEPARTMENT Provider Note   CSN: [id number] Arrival date & time: 10/14/22  2148     History  Chief Complaint  Patient presents with   Laceration    Kaylee Short is a 31 y.o. female.   Laceration  This patient is a 31 year old female, she presents to the hospital after accidentally injuring her left second finger while she was trying to peel an apple.  She was using an apple peeler which accidentally rubbed up against the side of her second finger when she slipped causing a laceration and bleeding.  She applied a dressing and pressure and comes to the ER    Home Medications Prior to Admission medications   Medication Sig Start Date End Date Taking? Authorizing Provider  albuterol (PROVENTIL HFA;VENTOLIN HFA) 108 (90 Base) MCG/ACT inhaler Inhale 1-2 puffs into the lungs every 6 (six) hours as needed for wheezing or shortness of breath. 02/19/16   Elson Areas, PA-C  amLODipine (NORVASC) 5 MG tablet Take 5 mg by mouth daily. 01/01/18 05/25/22  [provider]  cholecalciferol (VITAMIN D) 25 MCG (1000 UNIT) tablet Take 1,000 Units by mouth daily.    [provider]  clopidogrel (PLAVIX) 75 MG tablet Take 1 tablet (75 mg total) by mouth daily. 04/06/22   Baglia, Corrina, PA-C  hydrocortisone cream 1 % Apply 1 application. topically daily as needed for itching (Eczema).    [provider]  SUBOXONE 8-2 MG FILM Dissolve 1 film under tongue twice daily and 1/2 of film under tongue before bedtime. Patient taking differently: Take 8.2 Film by mouth in the morning, at noon, and at bedtime. 09/08/21         Allergies    Patient has no known allergies.    Review of Systems   Review of Systems  Skin:  Positive for wound.    Physical Exam Updated Vital Signs BP (!) 187/100 (BP Location: Right Arm)   Pulse 71   Temp 97.8 F (36.6 C) (Oral)   Resp 15   Ht 1.715 m (5' 7.5")   Wt 93 kg   LMP 09/14/2022   SpO2 99%   BMI 31.63 kg/m   Physical Exam Vitals and nursing note reviewed.  Constitutional:      Appearance: She is well-developed. She is not diaphoretic.  HENT:     Head: Normocephalic and atraumatic.  Eyes:     General:        Right eye: No discharge.        Left eye: No discharge.     Conjunctiva/sclera: Conjunctivae normal.  Pulmonary:     Effort: Pulmonary effort is normal. No respiratory distress.  Musculoskeletal:        General: Tenderness present.  Skin:    General: Skin is warm and dry.     Findings: No erythema or rash.     Comments: 5 mm very superficial laceration, the skin is extremely superficial and there is no blood flow, this does not involve the joint, it is extremely superficial, bleeding controlled with minimal pressure but no foreign body, does not involve the nailbed  Neurological:     Mental Status: She is alert.     Coordination: Coordination normal.     ED Results / Procedures / Treatments   Labs (all labs ordered are listed, but only abnormal results are displayed) Labs Reviewed - No data to display  EKG None  Radiology No results found.  Procedures Procedures    Medications Ordered in  ED Medications  povidone-iodine (BETADINE) 10 % ointment (has no administration in time range)  bacitracin ointment (has no administration in time range)    ED Course/ Medical Decision Making/ A&P                           Medical Decision Making Risk OTC drugs.   Very superficial laceration over the PIP of the left second digit on the radial aspect of the finger.  This will not hold stitches, it is very superficial similar to a skin tear.  Patient agreeable to clean wound here, topical antibiotics, sterile dressing, static finger splint for couple of days, follow-up as needed, patient agreeable  Offered tetanus, patient declines        Final Clinical Impression(s) / ED Diagnoses Final diagnoses:  Laceration of left index finger without foreign body without damage to  nail, initial encounter    Rx / DC Orders ED Discharge Orders     None         Eber Hong, MD 10/14/22 2205

## 2022-10-14 NOTE — ED Triage Notes (Signed)
Pt accidentally cut to left index finger while peeling some apples with a peeler. Last tetanus shot is unknown.

## 2022-10-18 ENCOUNTER — Emergency Department (HOSPITAL_COMMUNITY): Payer: Medicaid Other

## 2022-10-18 ENCOUNTER — Other Ambulatory Visit: Payer: Self-pay

## 2022-10-18 ENCOUNTER — Emergency Department (HOSPITAL_COMMUNITY)
Admission: EM | Admit: 2022-10-18 | Discharge: 2022-10-18 | Disposition: A | Discharge status: Home / Self Care | Payer: Medicaid Other | Attending: Emergency Medicine | Admitting: Emergency Medicine

## 2022-10-18 ENCOUNTER — Encounter (HOSPITAL_COMMUNITY): Payer: Self-pay

## 2022-10-18 DIAGNOSIS — R519 Headache, unspecified: Secondary | ICD-10-CM | POA: Diagnosis not present

## 2022-10-18 DIAGNOSIS — I10 Essential (primary) hypertension: Secondary | ICD-10-CM | POA: Insufficient documentation

## 2022-10-18 DIAGNOSIS — Z79899 Other long term (current) drug therapy: Secondary | ICD-10-CM | POA: Insufficient documentation

## 2022-10-18 DIAGNOSIS — R911 Solitary pulmonary nodule: Secondary | ICD-10-CM | POA: Diagnosis not present

## 2022-10-18 DIAGNOSIS — Z7902 Long term (current) use of antithrombotics/antiplatelets: Secondary | ICD-10-CM | POA: Diagnosis not present

## 2022-10-18 DIAGNOSIS — Z86718 Personal history of other venous thrombosis and embolism: Secondary | ICD-10-CM | POA: Diagnosis not present

## 2022-10-18 DIAGNOSIS — R072 Precordial pain: Secondary | ICD-10-CM | POA: Diagnosis present

## 2022-10-18 DIAGNOSIS — Z95820 Peripheral vascular angioplasty status with implants and grafts: Secondary | ICD-10-CM | POA: Insufficient documentation

## 2022-10-18 DIAGNOSIS — R079 Chest pain, unspecified: Secondary | ICD-10-CM

## 2022-10-18 LAB — BASIC METABOLIC PANEL
Anion gap: 10 (ref 5–15)
BUN: 14 mg/dL (ref 6–20)
CO2: 25 mmol/L (ref 22–32)
Calcium: 9 mg/dL (ref 8.9–10.3)
Chloride: 104 mmol/L (ref 98–111)
Creatinine, Ser: 0.8 mg/dL (ref 0.44–1.00)
GFR, Estimated: >60 mL/min (ref >60)
Glucose, Bld: 101 mg/dL — ABNORMAL HIGH (ref 70–99)
Potassium: 4 mmol/L (ref 3.5–5.1)
Sodium: 139 mmol/L (ref 135–145)

## 2022-10-18 LAB — CBC
HCT: 38.3 % (ref 36.0–46.0)
Hemoglobin: 13 g/dL (ref 12.0–15.0)
MCH: 29.8 pg (ref 26.0–34.0)
MCHC: 33.9 g/dL (ref 30.0–36.0)
MCV: 87.8 fL (ref 80.0–100.0)
Platelets: 158 10*3/uL (ref 150–400)
RBC: 4.36 MIL/uL (ref 3.87–5.11)
RDW: 12.2 % (ref 11.5–15.5)
WBC: 4 10*3/uL (ref 4.0–10.5)
nRBC: 0 % (ref 0.0–0.2)

## 2022-10-18 LAB — TROPONIN I (HIGH SENSITIVITY)
Troponin I (High Sensitivity): <2 ng/L (ref <18)
Troponin I (High Sensitivity): <2 ng/L (ref <18)

## 2022-10-18 LAB — POC URINE PREG, ED: Preg Test, Ur: NEGATIVE

## 2022-10-18 MED ORDER — IOHEXOL 350 MG/ML SOLN
100.0000 mL | Freq: Once | INTRAVENOUS | Status: AC | PRN
  Administered 2022-10-18: 100 mL via INTRAVENOUS

## 2022-10-18 MED ORDER — KETOROLAC TROMETHAMINE 30 MG/ML IJ SOLN
15.0000 mg | Freq: Once | INTRAMUSCULAR | Status: AC
  Administered 2022-10-18: 15 mg via INTRAVENOUS
  Filled 2022-10-18: qty 1

## 2022-10-18 MED ORDER — ACETAMINOPHEN 500 MG PO TABS
1000.0000 mg | ORAL_TABLET | Freq: Once | ORAL | Status: AC
  Administered 2022-10-18: 1000 mg via ORAL
  Filled 2022-10-18: qty 2

## 2022-10-18 NOTE — ED Triage Notes (Signed)
Pt presents with chest pain that started this morning. Describes pain as sharp that is intermittent with tingling in right arm. Also endorses high blood pressure at 171/110. T

## 2022-10-18 NOTE — Discharge Instructions (Signed)
CT results requiring follow up CT scan:  Tiny right pulmonary nodules measuring up to 4 mm. No follow-up needed if patient is low-risk (and has no known or suspected primary neoplasm). Non-contrast chest CT can be considered in 12 months if patient is high-risk. This recommendation follows the consensus statement: Guidelines for Management of Incidental Pulmonary Nodules Detected on CT Images: From the Fleischner Society 2017; Radiology 2017; 284:228-243.

## 2022-10-18 NOTE — ED Provider Notes (Signed)
[facility] EMERGENCY DEPARTMENT Provider Note   CSN: [id number] Arrival date & time: 10/18/22  0046     History  Chief Complaint  Patient presents with   Chest Pain    Kaylee Short is a 31 y.o. female.  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  Patient states that she was in her normal state of health and started have some sharp retrosternal chest pain associate with hypertension earlier today.  Did not have any shortness of breath and the pain seemed to improve while waiting here however started having some intermittent stabbing pain in other areas.  She is concerned for a blood clot as she has had those in the past and is not on anticoagulation at this time.  No lower extremity swelling.  No trauma.  No recent fever or cough.   Chest Pain      Home Medications Prior to Admission medications   Medication Sig Start Date End Date Taking? Authorizing Provider  albuterol (PROVENTIL HFA;VENTOLIN HFA) 108 (90 Base) MCG/ACT inhaler Inhale 1-2 puffs into the lungs every 6 (six) hours as needed for wheezing or shortness of breath. 02/19/16   Fransico Meadow, PA-C  amLODipine (NORVASC) 5 MG tablet Take 5 mg by mouth daily. 01/01/18 05/25/22  [provider]  cholecalciferol (VITAMIN D) 25 MCG (1000 UNIT) tablet Take 1,000 Units by mouth daily.    [provider]  clopidogrel (PLAVIX) 75 MG tablet Take 1 tablet (75 mg total) by mouth daily. 04/06/22   Baglia, Corrina, PA-C  hydrocortisone cream 1 % Apply 1 application. topically daily as needed for itching (Eczema).    [provider]  SUBOXONE 8-2 MG FILM Dissolve 1 film under tongue twice daily and 1/2 of film under tongue before bedtime. Patient taking differently: Take 8.2 Film by mouth in the morning, at noon, and at bedtime. 09/08/21         Allergies    Patient has no known allergies.     Review of Systems   Review of Systems  Cardiovascular:  Positive for chest pain.    Physical Exam Updated Vital Signs BP 131/84   Pulse 61   Temp 98 F (36.7 C) (Oral)   Resp 12   Ht 5\' 7"  (1.702 m)   Wt 93 kg   LMP 09/14/2022   SpO2 99%   BMI 32.11 kg/m  Physical Exam Vitals and nursing note reviewed.  Constitutional:      Appearance: She is well-developed.  HENT:     Head: Normocephalic and atraumatic.  Cardiovascular:     Rate and Rhythm: Normal rate and regular rhythm.  Pulmonary:     Effort: No respiratory distress.     Breath sounds: No stridor.  Abdominal:     General: There is no distension.  Musculoskeletal:     Cervical back: Normal range of motion.  Skin:    General: Skin is warm and dry.  Neurological:     Mental Status: She is alert.     ED Results / Procedures / Treatments   Labs (all labs ordered are listed, but only abnormal results are displayed) Labs Reviewed  BASIC METABOLIC PANEL - Abnormal; Notable for the following components:      Result Value   Glucose, Bld 101 (*)    All other components within normal limits  CBC  POC URINE PREG, ED  TROPONIN I (HIGH SENSITIVITY)  TROPONIN I (HIGH SENSITIVITY)    EKG None  Radiology CT Angio Chest PE W and/or Wo Contrast  Result Date: 10/18/2022 CLINICAL DATA:  Chest pain.  Hypertension. EXAM: CT ANGIOGRAPHY CHEST WITH CONTRAST TECHNIQUE: Multidetector CT imaging of the chest was performed using the standard protocol during bolus administration of intravenous contrast. Multiplanar CT image reconstructions and MIPs were obtained to evaluate the vascular anatomy. RADIATION DOSE REDUCTION: This exam was performed according to the departmental dose-optimization program which includes automated exposure control, adjustment of the mA and/or kV according to patient size and/or use of iterative reconstruction technique. CONTRAST:  OMNIPAQUE IOHEXOL 350 MG/ML SOLN COMPARISON:  None Available.  FINDINGS: Cardiovascular: The heart size is normal. No substantial pericardial effusion. No thoracic aortic aneurysm. No substantial atherosclerosis of the thoracic aorta. There is no filling defect within the opacified pulmonary arteries to suggest the presence of an acute pulmonary embolus. Mediastinum/Nodes: No mediastinal lymphadenopathy. There is no hilar lymphadenopathy. The esophagus has normal imaging features. There is no axillary lymphadenopathy. Lungs/Pleura: 3 mm right middle lobe nodule identified on 74/7 with a 4 mm right lower lobe nodule seen on 67/7. No pneumothorax. No focal airspace consolidation. No pleural effusion. Upper Abdomen: Unremarkable. Musculoskeletal: No worrisome lytic or sclerotic osseous abnormality. Review of the MIP images confirms the above findings. IMPRESSION: 1. No CT evidence for acute pulmonary embolus. 2. Tiny right pulmonary nodules measuring up to 4 mm. No follow-up needed if patient is low-risk (and has no known or suspected primary neoplasm). Non-contrast chest CT can be considered in 12 months if patient is high-risk. This recommendation follows the consensus statement: Guidelines for Management of Incidental Pulmonary Nodules Detected on CT Images: From the Fleischner Society 2017; Radiology 2017; 284:228-243. Electronically Signed   By: Kennith Center M.D.   On: 10/18/2022 06:10    Procedures Procedures    Medications Ordered in ED Medications  acetaminophen (TYLENOL) tablet 1,000 mg (1,000 mg Oral Given 10/18/22 0458)  iohexol (OMNIPAQUE) 350 MG/ML injection 100 mL (100 mLs Intravenous Contrast Given 10/18/22 0531)  ketorolac (TORADOL) 30 MG/ML injection 15 mg (15 mg Intravenous Given 10/18/22 3329)    ED Course/ Medical Decision Making/ A&P                           Medical Decision Making Amount and/or Complexity of Data Reviewed Labs: ordered. Radiology: ordered.  Risk OTC drugs. Prescription drug management.  Troponin is negative but with  her history of DVT and unexplained chest pain.  Concern for possible PE we will get a PE scan.  Suspect if this is negative patient can be discharged. PE scan negative but does have a pulmonary nodule.  Has a history of smoking and so we will need this followed up.  She is aware of this and is on her discharge papers.  No evidence of PE, pneumothorax, pneumonia or other acute etiologies for symptoms.  may be musculoskeletal.  Will treat as same with PCP follow-up. Of note patient had a headache that developed while she was here she thinks is from being up all night and not have anything to eat or drink.  This did not improve much with Tylenol but Toradol helped.  Low suspicion for emergent etiology of the same without any other red flags.  Final Clinical Impression(s) / ED Diagnoses Final diagnoses:  Nonspecific chest pain  Acute nonintractable headache, unspecified headache type  Pulmonary nodule  Rx / DC Orders ED Discharge Orders     None         Kayse Puccini, Corene Cornea, MD 10/18/22 2314

## 2022-10-26 DIAGNOSIS — R911 Solitary pulmonary nodule: Secondary | ICD-10-CM | POA: Insufficient documentation

## 2023-03-01 ENCOUNTER — Telehealth: Payer: Self-pay | Admitting: *Deleted

## 2023-03-01 NOTE — Telephone Encounter (Signed)
Patient called complaining of pain in left groin and pain and swelling in left leg.  Scheduled IVC/ILIAC venous duplex and office visit May 15. Patient knows to call if pain worsens.

## 2023-03-20 ENCOUNTER — Other Ambulatory Visit: Payer: Self-pay | Admitting: *Deleted

## 2023-03-20 DIAGNOSIS — I871 Compression of vein: Secondary | ICD-10-CM

## 2023-03-29 ENCOUNTER — Ambulatory Visit: Payer: Medicaid Other | Admitting: Physician Assistant

## 2023-03-29 ENCOUNTER — Ambulatory Visit (HOSPITAL_COMMUNITY)
Admission: RE | Admit: 2023-03-29 | Discharge: 2023-03-29 | Disposition: A | Discharge status: Home / Self Care | Payer: Medicaid Other | Source: Ambulatory Visit | Attending: Vascular Surgery | Admitting: Vascular Surgery

## 2023-03-29 ENCOUNTER — Encounter: Payer: Self-pay | Admitting: Physician Assistant

## 2023-03-29 VITALS — BP 128/85 | HR 80 | Temp 98.2°F | Wt 203.0 lb

## 2023-03-29 DIAGNOSIS — I871 Compression of vein: Secondary | ICD-10-CM

## 2023-03-29 NOTE — Progress Notes (Signed)
Office Note     CC:  follow up Requesting Provider:  [facility]  HPI: Kaylee Short is a 32 y.o. 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 She is s/p left common iliac vein stenting on 04/04/22 by Dr. Randie Heinz. At her last follow up 1 year ago she was doing well with improvement of her left leg edema. Duplex demonstrated widely patent stent.  Today she reports not doing well due to bilateral groin/ hip discomfort over the last several months. She says post procedure after several weeks she had resolution of any pain in her legs, but over past several months she has started having severe pain in her hip and groin area, left greater than right. This occurs on prolonged sitting or standing. She has noticed it while playing with her kids or even while driving. Has been taking Advil for pain but this seems to provide minimal relief. Has been elevating her legs intermittently if she notices any swelling but this does not help the hip pain. Otherwise reports minimal swelling in her legs. No pain otherwise in her legs on ambulation or rest. She does not tolerate the thigh high compression stockings. She says she will intermittently wear the knee high stockings. She is no longer on Plavix, but does take Aspirin 81 mg daily.   Past Medical History:  Diagnosis Date   Asthma    Bronchitis    Carpal tunnel syndrome    Bilateral   Hypertension    Tennis elbow    bilateral    Past Surgical History:  Procedure Laterality Date   CESAREAN SECTION     IVC VENOGRAPHY N/A 04/04/2022   Procedure: IVC Venography;  Surgeon: Maeola Harman, MD;  Location: [facility] INVASIVE CV LAB;  Service: Cardiovascular;  Laterality: N/A;   LOWER EXTREMITY VENOGRAPHY N/A 04/04/2022   Procedure: LOWER EXTREMITY VENOGRAPHY;  Surgeon: Maeola Harman, MD;  Location: [facility] INVASIVE CV LAB;  Service: Cardiovascular;  Laterality: N/A;    PERIPHERAL VASCULAR INTERVENTION Left 04/04/2022   Procedure: PERIPHERAL VASCULAR INTERVENTION;  Surgeon: Maeola Harman, MD;  Location: [facility] INVASIVE CV LAB;  Service: Cardiovascular;  Laterality: Left;  COMMON VENO    Social History   Socioeconomic History   Marital status: Single    Spouse name: Not on file   Number of children: Not on file   Years of education: Not on file   Highest education level: Not on file  Occupational History   Not on file  Tobacco Use   Smoking status: Every Day    Packs/day: 1    Types: E-cigarettes, Cigarettes    Last attempt to quit: 01/22/2021    Years since quitting: 2.1    Passive exposure: Never   Smokeless tobacco: Never  Vaping Use   Vaping Use: Never used  Substance and Sexual Activity   Alcohol use: No   Drug use: No   Sexual activity: Yes    Birth control/protection: Pill, None  Other Topics Concern   Not on file  Social History Narrative   Not on file   Social Determinants of Health   Financial Resource Strain: Not on file  Food Insecurity: Not on file  Transportation Needs: Not on file  Physical Activity: Not on file  Stress: Not on file  Social Connections: Not on file  Intimate Partner Violence: Not on file   No family history on file.  Current Outpatient Medications  Medication  Sig Dispense Refill   albuterol (PROVENTIL HFA;VENTOLIN HFA) 108 (90 Base) MCG/ACT inhaler Inhale 1-2 puffs into the lungs every 6 (six) hours as needed for wheezing or shortness of breath. 1 Inhaler 0   amLODipine (NORVASC) 5 MG tablet Take 10 mg by mouth daily.     aspirin EC 81 MG tablet Take 81 mg by mouth daily. Swallow whole.     SUBOXONE 8-2 MG FILM Dissolve 1 film under tongue twice daily and 1/2 of film under tongue before bedtime. (Patient taking differently: Take 8.2 Film by mouth in the morning, at noon, and at bedtime.) 75 each 0   No current facility-administered medications for this visit.    No Known  Allergies   REVIEW OF SYSTEMS:  [X]  denotes positive finding, [ ]  denotes negative finding Cardiac  Comments:  Chest pain or chest pressure:    Shortness of breath upon exertion:    Short of breath when lying flat:    Irregular heart rhythm:        Vascular    Pain in calf, thigh, or hip brought on by ambulation:    Pain in feet at night that wakes you up from your sleep:     Blood clot in your veins:    Leg swelling:         Pulmonary    Oxygen at home:    Productive cough:     Wheezing:         Neurologic    Sudden weakness in arms or legs:     Sudden numbness in arms or legs:     Sudden onset of difficulty speaking or slurred speech:    Temporary loss of vision in one eye:     Problems with dizziness:         Gastrointestinal    Blood in stool:     Vomited blood:         Genitourinary    Burning when urinating:     Blood in urine:        Psychiatric    Major depression:         Hematologic    Bleeding problems:    Problems with blood clotting too easily:        Skin    Rashes or ulcers:        Constitutional    Fever or chills:      PHYSICAL EXAMINATION:  Vitals:   03/29/23 0839  BP: 128/85  Pulse: 80  Temp: 98.2 F (36.8 C)  TempSrc: Temporal  SpO2: 96%  Weight: 203 lb (92.1 kg)    General:  WDWN in NAD; vital signs documented above Gait: Normal HENT: WNL, normocephalic Pulmonary: normal non-labored breathing without  wheezing Cardiac: regular HR Abdomen: soft Vascular Exam/Pulses: 2+ DP and PT pulses bilaterally, feet warm and well perfused Extremities: without ischemic changes, without Gangrene , without cellulitis; without open wounds; without edema Musculoskeletal: no muscle wasting or atrophy  Neurologic: A&O X 3 Psychiatric:  The pt has Normal affect.   Non-Invasive Vascular Imaging:   VAS [place] IVC/ Iliac left: Summary:  IVC/Iliac: No evidence of thrombus in IVC and Iliac veins, patent left common iliac stent.     ASSESSMENT/PLAN:: 32 y.o. 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 She is s/p left common iliac vein stenting on 04/04/22 by Dr. Randie Heinz. She currently is not having any swelling or symptoms associated with her chronic venous insufficiency or  iliac vein stent. She is having bilateral hip pain that I do not think is associated and sounds more musculoskeletal in nature. I have recommended she follow up with PCP for further evaluation of this.  - Duplex today demonstrates patent left Common iliac vein stent - Elevation and compression stockings PRN - Continue Aspirin 81 mg daily -She will follow up in 1 year with repeat IVC/ left Iliac duplex   Kaylee Congress, PA-C [facility] [phone]  Clinic MD:   [place]/ Randie Heinz

## 2023-04-05 ENCOUNTER — Ambulatory Visit: Payer: Medicaid Other | Admitting: Gastroenterology

## 2023-04-06 ENCOUNTER — Other Ambulatory Visit: Payer: Self-pay

## 2023-04-06 DIAGNOSIS — I871 Compression of vein: Secondary | ICD-10-CM

## 2023-04-07 ENCOUNTER — Encounter: Payer: Self-pay | Admitting: Gastroenterology

## 2023-04-07 ENCOUNTER — Ambulatory Visit: Payer: Medicaid Other | Admitting: Gastroenterology

## 2023-04-07 VITALS — BP 128/88 | HR 82 | Temp 98.1°F | Ht 67.0 in | Wt 208.2 lb

## 2023-04-07 DIAGNOSIS — R194 Change in bowel habit: Secondary | ICD-10-CM | POA: Diagnosis not present

## 2023-04-07 DIAGNOSIS — R198 Other specified symptoms and signs involving the digestive system and abdomen: Secondary | ICD-10-CM

## 2023-04-07 DIAGNOSIS — R1031 Right lower quadrant pain: Secondary | ICD-10-CM | POA: Diagnosis not present

## 2023-04-07 DIAGNOSIS — R109 Unspecified abdominal pain: Secondary | ICD-10-CM | POA: Insufficient documentation

## 2023-04-07 NOTE — Patient Instructions (Addendum)
Start a fiber supplement, goal of 3 to 4 g of fiber from a supplement per day.  Fiber Gummies or chewables are good.  Benefiber powder is also a good option. Please complete your blood work at [facility].  We will be in touch with results and further recommendations. Consider avoiding foods below that increase gas production and bloating/discomfort.  Foods to avoid Fruits Fresh, dried, and juiced forms of apple, pear, watermelon, peach, plum, cherries, apricots, blackberries, boysenberries, figs, nectarines, and mango. Avocado. Vegetables Chicory root, artichoke, asparagus, cabbage, snow peas, Brussels sprouts, broccoli, sugar snap peas, mushrooms, celery, and cauliflower. Onions, garlic, leeks, and the white part of scallions. Grains Wheat, including kamut, durum, and semolina. Barley and bulgur. Couscous. Wheat-based cereals. Wheat noodles, bread, crackers, and pastries. Meats and other proteins Fried or fatty meat. Sausage. Cashews and pistachios. Soybeans, baked beans, black beans, chickpeas, kidney beans, fava beans, navy beans, lentils, black-eyed peas, and split peas. Dairy Milk, yogurt, ice cream, and soft cheese. Cream and sour cream. Milk-based sauces. Custard. Buttermilk. Soy milk. Seasoning and other foods Any sugar-free gum or candy. Foods that contain artificial sweeteners such as sorbitol, mannitol, isomalt, or xylitol. Foods that contain honey, high-fructose corn syrup, or agave. Bouillon, vegetable stock, beef stock, and chicken stock. Garlic and onion powder. Condiments made with onion, such as hummus, chutney, pickles, relish, salad dressing, and salsa. Tomato paste. Beverages Chicory-based drinks. Coffee substitutes. Chamomile tea. Fennel tea. Sweet or fortified wines such as port or sherry. Diet soft drinks made with isomalt, mannitol, maltitol, sorbitol, or xylitol. Apple, pear, and mango juice. Juices with high-fructose corn syrup. The items listed above may not be a complete  list of foods and beverages you should avoid. Contact a dietitian for more information.

## 2023-04-07 NOTE — Progress Notes (Signed)
GI Office Note    Referring Provider: Orbie Hurst, NP Primary Care Physician:  [facility]  Primary Gastroenterologist: Hennie Duos. Marletta Lor, DO   Chief Complaint   Chief Complaint  Patient presents with   abnormal bowels   History of Present Illness   Regene Eapen is a 32 y.o. female with history of May Thurner syndrome status post stenting of the left common iliac vein, hypertension, presenting today at the request of Quentin Mulling, FNP for further evaluation of pelvic pain associated with GI symptoms.  Extensively evaluated by GYN, per referring provider's note "previous exploratory laparotomy and ultrasound normal.  Current plan for trial of norethindrone in hopes to achieve amenorrhea as well as cessation of pain.  Plan for consult with pelvic floor physical therapy due to ongoing nature of pelvic pain with new onset dyspareunia.  Referral to [facility] for consult if pelvic pain does not improve with norethindrone."  Seen recently by vein and vascular Mar 29, 2023.  Reporting bilateral groin/hip discomfort for several months, left greater than right.  Duplex study demonstrated patent left common iliac vein stent.  Symptoms felt to be musculoskeletal.  Patient reports abdominal pain, typically pelvic region, feels like menstrual cycle pain all the time and when she is having her cycle the pain is even worse. She has had these issues since last C-section five years ago. She believes it could be related to having sterilization surgery at same time. She also has had GI issues for years. Wake up in middle of night with terrible pain and diarrhea. ED visits and after evaluation has been told she had gas. "Lives off of" [employer] and [address]. Constipation issues at times. Mixed with normal stools. Takes awhile to have BM, doesn't strain. Has mucous in stools. No melena, brbpr. Seems to have issues with her stomach or BMs 2-3 days a week. At times uses miralax. Typically passes  small stool every day but often incomplete. She does report heavy periods, sometimes passes clots, associated with a lot of pain. Recently started on norethindrone but patient states she can't take it and has to call her gyn.  Eating healthier. Trying to lose weight. Eating more salads. Still eating dairy, but not milk. Popcorn makes symptoms worse. Heartburn all the time. TUMS helps. No dysphagia. No n/v.  No regular use of NSAIDs.   Labs from February 2024: White blood cell count 3700, hemoglobin 13.3, platelets 173,000, sodium 138, potassium 3.8, creatinine 0.75, albumin 4.3, total bilirubin 0.6, alk phos 77, AST 15, ALT 23, hCG quantitative less than 1.  Right upper quadrant ultrasound in 2022: Nonspecific tenderness over the gallbladder per technologist.  CTA abdomen and pelvis with and without contrast June 2023: Endovascular stent noted in the left common iliac vein, evaluation of stent limited due to timing of contrast.  Medications   Current Outpatient Medications  Medication Sig Dispense Refill   albuterol (PROVENTIL HFA;VENTOLIN HFA) 108 (90 Base) MCG/ACT inhaler Inhale 1-2 puffs into the lungs every 6 (six) hours as needed for wheezing or shortness of breath. 1 Inhaler 0   amLODipine (NORVASC) 10 MG tablet Take 10 mg by mouth daily.     aspirin EC 81 MG tablet Take 81 mg by mouth daily. Swallow whole.     SUBOXONE 8-2 MG FILM Dissolve 1 film under tongue twice daily and 1/2 of film under tongue before bedtime. (Patient taking differently: Take 8.2 Film by mouth in the morning, at noon, and at bedtime.) 75  each 0   No current facility-administered medications for this visit.    Allergies   Allergies as of 04/07/2023   (No Known Allergies)    Past Medical History   Past Medical History:  Diagnosis Date   Asthma    Bronchitis    Carpal tunnel syndrome    Bilateral   Hypertension    Tennis elbow    bilateral    Past Surgical History   Past Surgical History:   Procedure Laterality Date   CESAREAN SECTION     three   IVC VENOGRAPHY N/A 04/04/2022   Procedure: IVC Venography;  Surgeon: Maeola Harman, MD;  Location: [facility] INVASIVE CV LAB;  Service: Cardiovascular;  Laterality: N/A;   laporoscopy pelvic     normal   LOWER EXTREMITY VENOGRAPHY N/A 04/04/2022   Procedure: LOWER EXTREMITY VENOGRAPHY;  Surgeon: Maeola Harman, MD;  Location: [facility] INVASIVE CV LAB;  Service: Cardiovascular;  Laterality: N/A;   PERIPHERAL VASCULAR INTERVENTION Left 04/04/2022   Procedure: PERIPHERAL VASCULAR INTERVENTION;  Surgeon: Maeola Harman, MD;  Location: [facility] INVASIVE CV LAB;  Service: Cardiovascular;  Laterality: Left;  COMMON VENO    Past Family History   History reviewed. No pertinent family history.  Past Social History   Social History   Socioeconomic History   Marital status: Single    Spouse name: Not on file   Number of children: Not on file   Years of education: Not on file   Highest education level: Not on file  Occupational History   Not on file  Tobacco Use   Smoking status: Every Day    Types: E-cigarettes    Passive exposure: Never   Smokeless tobacco: Never  Vaping Use   Vaping Use: Never used  Substance and Sexual Activity   Alcohol use: No   Drug use: No   Sexual activity: Yes    Birth control/protection: Pill, None  Other Topics Concern   Not on file  Social History Narrative   Not on file   Social Determinants of Health   Financial Resource Strain: Not on file  Food Insecurity: Not on file  Transportation Needs: Not on file  Physical Activity: Not on file  Stress: Not on file  Social Connections: Not on file  Intimate Partner Violence: Not on file    Review of Systems   General: Negative for anorexia, weight loss, fever, chills, fatigue, weakness. Eyes: Negative for vision changes.  ENT: Negative for hoarseness, difficulty swallowing , nasal congestion. CV: Negative for chest pain,  angina, palpitations, dyspnea on exertion, peripheral edema.  Respiratory: Negative for dyspnea at rest, dyspnea on exertion, cough, sputum, wheezing.  GI: See history of present illness. GU:  Negative for dysuria, hematuria, urinary incontinence, urinary frequency, nocturnal urination. See hpi MS: Negative for joint pain, low back pain.  Derm: Negative for rash or itching.  Neuro: Negative for weakness, abnormal sensation, seizure, frequent headaches, memory loss,  confusion.  Psych: Negative for anxiety, depression, suicidal ideation, hallucinations.  Endo: Negative for unusual weight change.  Heme: Negative for bruising or bleeding. Allergy: Negative for rash or hives.  Physical Exam   BP 128/88 (BP Location: Right Arm, Patient Position: Sitting, Cuff Size: Large)   Pulse 82   Temp 98.1 F (36.7 C) (Oral)   Ht 5\' 7"  (1.702 m)   Wt 208 lb 3.2 oz (94.4 kg)   LMP 04/07/2023 (Approximate)   BMI 32.61 kg/m    General: Well-nourished, well-developed in no acute distress.  Head: Normocephalic, atraumatic.   Eyes: Conjunctiva pink, no icterus. Mouth: Oropharyngeal mucosa moist and pink   Neck: Supple without thyromegaly, masses, or lymphadenopathy.  Lungs: Clear to auscultation bilaterally.  Heart: Regular rate and rhythm, no murmurs rubs or gallops.  Abdomen: Bowel sounds are normal,  nondistended, no hepatosplenomegaly or masses, no abdominal bruits or hernia, no rebound or guarding.  Mild tenderness in right abdomen Rectal: not performed Extremities: No lower extremity edema. No clubbing or deformities.  Neuro: Alert and oriented x 4 , grossly normal neurologically.  Skin: Warm and dry, no rash or jaundice.   Psych: Alert and cooperative, normal mood and affect.  Labs   See hpi  Imaging Studies   VAS [place] IVC/ILIAC (VENOUS ONLY)  Result Date: 03/29/2023 IVC/ILIAC STUDY Patient Name:  JACKLYN RUESCH  Date of Exam:   03/29/2023 Medical Rec #: [id number]         Accession #:     [id number] Date of Birth: 1991/08/01          Patient Gender: F Patient Age:   19 years Exam Location:  [facility] Procedure:      VAS [place] IVC/ILIAC (VENOUS ONLY) Referring Phys: Emilie Rutter --------------------------------------------------------------------------------  Indications: May-Thurner syndrome-Chronic venous swelling Vascular Interventions: 04/04/22-Stent of left common iliac vein.  Comparison Study: 05/23/22 prior Performing Technologist: [place] Ponder RVS  Examination Guidelines: A complete evaluation includes B-mode imaging, spectral Doppler, color Doppler, and power Doppler as needed of all accessible portions of each vessel. Bilateral testing is considered an integral part of a complete examination. Limited examinations for reoccurring indications may be performed as noted.  IVC/Iliac Findings: +----------+------+--------+--------+    IVC    PatentThrombusComments +----------+------+--------+--------+ IVC Prox  patent                 +----------+------+--------+--------+ IVC Mid   patent                 +----------+------+--------+--------+ IVC Distalpatent                 +----------+------+--------+--------+  +-------------------+---------+-----------+---------+-----------+--------+         CIV        RT-PatentRT-ThrombusLT-PatentLT-ThrombusComments +-------------------+---------+-----------+---------+-----------+--------+ Common Iliac Prox   patent              patent                      +-------------------+---------+-----------+---------+-----------+--------+ Common Iliac Mid    patent              patent                      +-------------------+---------+-----------+---------+-----------+--------+ Common Iliac Distal patent              patent                      +-------------------+---------+-----------+---------+-----------+--------+  +-------------------------+---------+-----------+---------+-----------+--------+             EIV           RT-PatentRT-ThrombusLT-PatentLT-ThrombusComments +-------------------------+---------+-----------+---------+-----------+--------+ External Iliac Vein Prox  patent              patent                      +-------------------------+---------+-----------+---------+-----------+--------+ External Iliac Vein Mid   patent              patent                      +-------------------------+---------+-----------+---------+-----------+--------+  External Iliac Vein       patent              patent                      Distal                                                                    +-------------------------+---------+-----------+---------+-----------+--------+  Summary: IVC/Iliac: No evidence of thrombus in IVC and Iliac veins, patent left common iliac stent.  *See table(s) above for measurements and observations.  Electronically signed by Lemar Livings MD on 03/29/2023 at 1:17:11 PM.    Final     Assessment/Plan:   Abdominal pain/change in bowels: abdominal pain right sided on exam today. Complains of chronic pelvic pain. Extensive work up as outlined. Worse with menstrual cycle. Chronic GI issues as well without prior work up. Discussed with her today, it is not clear that her pelvic pain is GI related. We will work up her GI symptoms, initially with labs, screening for celiac, thyroid, anemia, IBD. Based on labs, she may require endoscopic evaluation. Will start her on daily fiber supplement to hope to improve bowel function. Limit high FODMAP foods. Handout provided.     Leanna Battles. Melvyn Neth, MHS, PA-C [facility]

## 2023-04-10 LAB — COMPREHENSIVE METABOLIC PANEL
ALT: 17 IU/L (ref 0–32)
AST: 13 IU/L (ref 0–40)
Albumin/Globulin Ratio: 1.9 (ref 1.2–2.2)
Albumin: 4.8 g/dL (ref 3.9–4.9)
Alkaline Phosphatase: 69 IU/L (ref 44–121)
BUN/Creatinine Ratio: 12 (ref 9–23)
BUN: 10 mg/dL (ref 6–20)
Bilirubin Total: 0.7 mg/dL (ref 0.0–1.2)
CO2: 22 mmol/L (ref 20–29)
Calcium: 9.2 mg/dL (ref 8.7–10.2)
Chloride: 101 mmol/L (ref 96–106)
Creatinine, Ser: 0.86 mg/dL (ref 0.57–1.00)
Globulin, Total: 2.5 g/dL (ref 1.5–4.5)
Glucose: 95 mg/dL (ref 70–99)
Potassium: 3.9 mmol/L (ref 3.5–5.2)
Sodium: 138 mmol/L (ref 134–144)
Total Protein: 7.3 g/dL (ref 6.0–8.5)
eGFR: 91 mL/min/{1.73_m2} (ref >59)

## 2023-04-10 LAB — CBC WITH DIFFERENTIAL/PLATELET
Basophils Absolute: 0.1 10*3/uL (ref 0.0–0.2)
Basos: 1 %
EOS (ABSOLUTE): 0.2 10*3/uL (ref 0.0–0.4)
Eos: 5 %
Hematocrit: 40.3 % (ref 34.0–46.6)
Hemoglobin: 13.7 g/dL (ref 11.1–15.9)
Immature Grans (Abs): 0 10*3/uL (ref 0.0–0.1)
Immature Granulocytes: 0 %
Lymphocytes Absolute: 1.3 10*3/uL (ref 0.7–3.1)
Lymphs: 36 %
MCH: 29.3 pg (ref 26.6–33.0)
MCHC: 34 g/dL (ref 31.5–35.7)
MCV: 86 fL (ref 79–97)
Monocytes Absolute: 0.4 10*3/uL (ref 0.1–0.9)
Monocytes: 11 %
Neutrophils Absolute: 1.6 10*3/uL (ref 1.4–7.0)
Neutrophils: 47 %
Platelets: 149 10*3/uL — ABNORMAL LOW (ref 150–450)
RBC: 4.67 x10E6/uL (ref 3.77–5.28)
RDW: 12.8 % (ref 11.7–15.4)
WBC: 3.5 10*3/uL (ref 3.4–10.8)

## 2023-04-10 LAB — TSH+FREE T4
Free T4: 1.36 ng/dL (ref 0.82–1.77)
TSH: 2.06 u[IU]/mL (ref 0.450–4.500)

## 2023-04-10 LAB — LIPASE: Lipase: 25 U/L (ref 14–72)

## 2023-04-10 LAB — TISSUE TRANSGLUTAMINASE, IGA: Transglutaminase IgA: <2 U/mL (ref 0–3)

## 2023-04-10 LAB — C-REACTIVE PROTEIN: CRP: 4 mg/L (ref 0–10)

## 2023-04-10 LAB — SEDIMENTATION RATE: Sed Rate: 6 mm/hr (ref 0–32)

## 2023-04-10 LAB — IGA: IgA/Immunoglobulin A, Serum: 184 mg/dL (ref 87–352)

## 2023-04-17 ENCOUNTER — Other Ambulatory Visit: Payer: Self-pay | Admitting: *Deleted

## 2023-04-17 ENCOUNTER — Encounter: Payer: Self-pay | Admitting: *Deleted

## 2023-04-17 DIAGNOSIS — R198 Other specified symptoms and signs involving the digestive system and abdomen: Secondary | ICD-10-CM

## 2023-04-17 DIAGNOSIS — R1031 Right lower quadrant pain: Secondary | ICD-10-CM

## 2023-04-17 MED ORDER — CLENPIQ 10-3.5-12 MG-GM -GM/175ML PO SOLN: 1.0000 | ORAL | 0 refills | Status: DC

## 2023-05-22 ENCOUNTER — Other Ambulatory Visit (HOSPITAL_COMMUNITY)
Admission: RE | Admit: 2023-05-22 | Discharge: 2023-05-22 | Disposition: A | Discharge status: Home / Self Care | Payer: Medicaid Other | Source: Ambulatory Visit | Attending: Internal Medicine | Admitting: Internal Medicine

## 2023-05-22 DIAGNOSIS — R1031 Right lower quadrant pain: Secondary | ICD-10-CM | POA: Insufficient documentation

## 2023-05-22 DIAGNOSIS — R198 Other specified symptoms and signs involving the digestive system and abdomen: Secondary | ICD-10-CM | POA: Insufficient documentation

## 2023-05-22 LAB — PREGNANCY, URINE: Preg Test, Ur: NEGATIVE

## 2023-05-23 ENCOUNTER — Ambulatory Visit (HOSPITAL_COMMUNITY)
Admission: RE | Admit: 2023-05-23 | Discharge: 2023-05-23 | Disposition: A | Discharge status: Home / Self Care | Payer: Medicaid Other | Source: Ambulatory Visit | Attending: Internal Medicine | Admitting: Internal Medicine

## 2023-05-23 ENCOUNTER — Encounter (HOSPITAL_COMMUNITY)
Admission: RE | Disposition: A | Discharge status: Home / Self Care | Payer: Self-pay | Source: Ambulatory Visit | Attending: Internal Medicine

## 2023-05-23 ENCOUNTER — Other Ambulatory Visit: Payer: Self-pay

## 2023-05-23 ENCOUNTER — Ambulatory Visit (HOSPITAL_BASED_OUTPATIENT_CLINIC_OR_DEPARTMENT_OTHER): Payer: Medicaid Other | Admitting: Anesthesiology

## 2023-05-23 ENCOUNTER — Ambulatory Visit (HOSPITAL_COMMUNITY): Payer: Medicaid Other | Admitting: Anesthesiology

## 2023-05-23 ENCOUNTER — Encounter (HOSPITAL_COMMUNITY): Payer: Self-pay

## 2023-05-23 DIAGNOSIS — J45909 Unspecified asthma, uncomplicated: Secondary | ICD-10-CM | POA: Diagnosis not present

## 2023-05-23 DIAGNOSIS — R109 Unspecified abdominal pain: Secondary | ICD-10-CM

## 2023-05-23 DIAGNOSIS — F17290 Nicotine dependence, other tobacco product, uncomplicated: Secondary | ICD-10-CM | POA: Insufficient documentation

## 2023-05-23 DIAGNOSIS — Z79899 Other long term (current) drug therapy: Secondary | ICD-10-CM | POA: Diagnosis not present

## 2023-05-23 DIAGNOSIS — D120 Benign neoplasm of cecum: Secondary | ICD-10-CM | POA: Diagnosis not present

## 2023-05-23 DIAGNOSIS — R194 Change in bowel habit: Secondary | ICD-10-CM | POA: Insufficient documentation

## 2023-05-23 DIAGNOSIS — K5730 Diverticulosis of large intestine without perforation or abscess without bleeding: Secondary | ICD-10-CM

## 2023-05-23 DIAGNOSIS — R102 Pelvic and perineal pain: Secondary | ICD-10-CM | POA: Diagnosis not present

## 2023-05-23 DIAGNOSIS — R1031 Right lower quadrant pain: Secondary | ICD-10-CM | POA: Insufficient documentation

## 2023-05-23 DIAGNOSIS — I10 Essential (primary) hypertension: Secondary | ICD-10-CM | POA: Diagnosis not present

## 2023-05-23 DIAGNOSIS — R198 Other specified symptoms and signs involving the digestive system and abdomen: Secondary | ICD-10-CM

## 2023-05-23 DIAGNOSIS — R1032 Left lower quadrant pain: Secondary | ICD-10-CM | POA: Insufficient documentation

## 2023-05-23 HISTORY — PX: COLONOSCOPY WITH PROPOFOL: SHX5780

## 2023-05-23 HISTORY — PX: POLYPECTOMY: SHX5525

## 2023-05-23 SURGERY — COLONOSCOPY WITH PROPOFOL
Anesthesia: General

## 2023-05-23 MED ORDER — LACTATED RINGERS IV SOLN: INTRAVENOUS | Status: DC

## 2023-05-23 MED ORDER — PROPOFOL 500 MG/50ML IV EMUL
INTRAVENOUS | Status: DC | PRN
  Administered 2023-05-23: 150 ug/kg/min via INTRAVENOUS

## 2023-05-23 MED ORDER — STERILE WATER FOR IRRIGATION IR SOLN
Status: DC | PRN
  Administered 2023-05-23: 120 mL

## 2023-05-23 MED ORDER — PROPOFOL 10 MG/ML IV BOLUS
INTRAVENOUS | Status: DC | PRN
  Administered 2023-05-23: 50 mg via INTRAVENOUS
  Administered 2023-05-23 (×3): 100 mg via INTRAVENOUS

## 2023-05-23 NOTE — Discharge Instructions (Addendum)
  Colonoscopy Discharge Instructions  Read the instructions outlined below and refer to this sheet in the next few weeks. These discharge instructions provide you with general information on caring for yourself after you leave the hospital. Your doctor may also give you specific instructions. While your treatment has been planned according to the most current medical practices available, unavoidable complications occasionally occur.   ACTIVITY You may resume your regular activity, but move at a slower pace for the next 24 hours.  Take frequent rest periods for the next 24 hours.  Walking will help get rid of the air and reduce the bloated feeling in your belly (abdomen).  No driving for 24 hours (because of the medicine (anesthesia) used during the test).   Do not sign any important legal documents or operate any machinery for 24 hours (because of the anesthesia used during the test).  NUTRITION Drink plenty of fluids.  You may resume your normal diet as instructed by your doctor.  Begin with a light meal and progress to your normal diet. Heavy or fried foods are harder to digest and may make you feel sick to your stomach (nauseated).  Avoid alcoholic beverages for 24 hours or as instructed.  MEDICATIONS You may resume your normal medications unless your doctor tells you otherwise.  WHAT YOU CAN EXPECT TODAY Some feelings of bloating in the abdomen.  Passage of more gas than usual.  Spotting of blood in your stool or on the toilet paper.  IF YOU HAD POLYPS REMOVED DURING THE COLONOSCOPY: No aspirin products for 7 days or as instructed.  No alcohol for 7 days or as instructed.  Eat a soft diet for the next 24 hours.  FINDING OUT THE RESULTS OF YOUR TEST Not all test results are available during your visit. If your test results are not back during the visit, make an appointment with your caregiver to find out the results. Do not assume everything is normal if you have not heard from your  caregiver or the medical facility. It is important for you to follow up on all of your test results.  SEEK IMMEDIATE MEDICAL ATTENTION IF: You have more than a spotting of blood in your stool.  Your belly is swollen (abdominal distention).  You are nauseated or vomiting.  You have a temperature over 101.  You have abdominal pain or discomfort that is severe or gets worse throughout the day.   Overall, your colon looked very healthy.  I did not see any active inflammation throughout your colon or end portion of your small bowel consistent with inflammatory bowel disease such as ulcerative colitis or Crohn's disease.  You had 1 very small polyp which I removed.  Await pathology results, office will contact you.  Recommend repeat colonoscopy in 10 years.  Follow-up in GI office in 2 to 3 months  I hope you have a great rest of your week!  Hennie Duos. Marletta Lor, D.O. Gastroenterology and Hepatology [facility]

## 2023-05-23 NOTE — Op Note (Signed)
[facility] Patient Name: Kaylee Short Procedure Date: 05/23/2023 9:09 AM MRN: [id number] Date of Birth: 10-15-1991 Attending MD: Hennie Duos. Marletta Lor , [place], [id number] CSN: [id number] Age: 32 Admit Type: Outpatient Procedure:                Colonoscopy Indications:              Abdominal pain in the left lower quadrant, Pelvic                            pain, Abdominal pain in the right lower quadrant,                            Change in bowel habits Providers:                Hennie Duos. Marletta Lor, DO, Sheran Fava, Dyann Ruddle Referring MD:              Medicines:                See the Anesthesia note for documentation of the                            administered medications Complications:            No immediate complications. Estimated Blood Loss:     Estimated blood loss: none. Estimated blood loss                            was minimal. Estimated blood loss was minimal. Procedure:                Pre-Anesthesia Assessment:                           - The anesthesia plan was to use monitored                            anesthesia care (MAC).                           After obtaining informed consent, the colonoscope                            was passed under direct vision. Throughout the                            procedure, the patient's blood pressure, pulse, and                            oxygen saturations were monitored continuously. The                            [id number]-HQ190L ([id number]) scope was introduced through                            the anus and advanced to  the the terminal ileum,                            with identification of the appendiceal orifice and                            IC valve. The colonoscopy was performed without                            difficulty. The patient tolerated the procedure                            well. The quality of the bowel preparation was                            evaluated using the BBPS  [facility] Bowel Preparation                            Scale) with scores of: Right Colon = 3, Transverse                            Colon = 3 and Left Colon = 3 (entire mucosa seen                            well with no residual staining, small fragments of                            stool or opaque liquid). The total BBPS score                            equals 9. Scope In: 9:28:16 AM Scope Out: 9:37:45 AM Scope Withdrawal Time: 0 hours 6 minutes 13 seconds  Total Procedure Duration: 0 hours 9 minutes 29 seconds  Findings:      The terminal ileum appeared normal.      A 3 mm polyp was found in the cecum. The polyp was sessile. The polyp       was removed with a cold snare. Resection and retrieval were complete.      A few medium-mouthed diverticula were found in the ascending colon.      The exam was otherwise without abnormality. Impression:               - The examined portion of the ileum was normal.                           - One 3 mm polyp in the cecum, removed with a cold                            snare. Resected and retrieved.                           - Diverticulosis in the ascending colon.                           -  The examination was otherwise normal. Moderate Sedation:      Per Anesthesia Care Recommendation:           - Patient has a contact number available for                            emergencies. The signs and symptoms of potential                            delayed complications were discussed with the                            patient. Return to normal activities tomorrow.                            Written discharge instructions were provided to the                            patient.                           - Resume previous diet.                           - Continue present medications.                           - Await pathology results.                           - Repeat colonoscopy in 10 years for screening                            purposes.                            - Return to GI clinic in 3 months. Procedure Code(s):        --- Professional ---                           [phone], Colonoscopy, flexible; with removal of                            tumor(s), polyp(s), or other lesion(s) by snare                            technique Diagnosis Code(s):        --- Professional ---                           D12.0, Benign neoplasm of cecum                           R10.32, Left lower quadrant pain                           R10.2, Pelvic and perineal pain  R10.31, Right lower quadrant pain                           R19.4, Change in bowel habit CPT copyright 2022 American Medical Association. All rights reserved. The codes documented in this report are preliminary and upon coder review may  be revised to meet current compliance requirements. Hennie Duos. Marletta Lor, DO Hennie Duos. Baird Polinski, DO 05/23/2023 9:41:02 AM This report has been signed electronically. Number of Addenda: 0

## 2023-05-23 NOTE — Transfer of Care (Signed)
Immediate Anesthesia Transfer of Care Note  Patient: Kaylee Short  Procedure(s) Performed: COLONOSCOPY WITH PROPOFOL POLYPECTOMY  Patient Location: Endoscopy Unit  Anesthesia Type:General  Level of Consciousness: awake  Airway & Oxygen Therapy: Patient Spontanous Breathing  Post-op Assessment: Report given to RN  Post vital signs: Reviewed and stable  Last Vitals:  Vitals Value Taken Time  BP    Temp    Pulse    Resp    SpO2      Last Pain:  Vitals:   05/23/23 0924  TempSrc:   PainSc: 0-No pain      Patients Stated Pain Goal: 7 (05/23/23 0750)  Complications: No notable events documented.

## 2023-05-23 NOTE — Anesthesia Postprocedure Evaluation (Signed)
Anesthesia Post Note  Patient: Kaylee Short  Procedure(s) Performed: COLONOSCOPY WITH PROPOFOL POLYPECTOMY  Patient location during evaluation: Endoscopy Anesthesia Type: General Level of consciousness: awake and alert Pain management: pain level controlled Vital Signs Assessment: post-procedure vital signs reviewed and stable Respiratory status: spontaneous breathing Cardiovascular status: blood pressure returned to baseline Postop Assessment: no apparent nausea or vomiting Anesthetic complications: no   No notable events documented.   Last Vitals:  Vitals:   05/23/23 0750  BP: (!) 142/93  Pulse: 82  Resp: (!) 8  Temp: 36.9 C  SpO2: 97%    Last Pain:  Vitals:   05/23/23 0924  TempSrc:   PainSc: 0-No pain                 [name]

## 2023-05-23 NOTE — Anesthesia Preprocedure Evaluation (Signed)
Anesthesia Evaluation  Patient identified by MRN, date of birth, ID band Patient awake    Reviewed: Allergy & Precautions, H&P , NPO status , Patient's Chart, lab work & pertinent test results  Airway Mallampati: III  TM Distance: >3 FB Neck ROM: Full    Dental no notable dental hx. (+) Teeth Intact, Dental Advisory Given   Pulmonary asthma , Current Smoker and Patient abstained from smoking.   Pulmonary exam normal breath sounds clear to auscultation       Cardiovascular hypertension, Pt. on medications Normal cardiovascular exam Rhythm:Regular Rate:Normal     Neuro/Psych  Neuromuscular disease  negative psych ROS   GI/Hepatic negative GI ROS, Neg liver ROS,,,  Endo/Other  negative endocrine ROS    Renal/GU negative Renal ROS  negative genitourinary   Musculoskeletal  (+) Arthritis , Osteoarthritis,    Abdominal   Peds negative pediatric ROS (+)  Hematology negative hematology ROS (+)   Anesthesia Other Findings   Reproductive/Obstetrics negative OB ROS                             Anesthesia Physical Anesthesia Plan  ASA: 2  Anesthesia Plan: General   Post-op Pain Management: Minimal or no pain anticipated   Induction: Intravenous  PONV Risk Score and Plan: 1 and Propofol infusion  Airway Management Planned: Nasal Cannula and Natural Airway  Additional Equipment:   Intra-op Plan:   Post-operative Plan:   Informed Consent: I have reviewed the patients History and Physical, chart, labs and discussed the procedure including the risks, benefits and alternatives for the proposed anesthesia with the patient or authorized representative who has indicated his/her understanding and acceptance.     Dental advisory given  Plan Discussed with: CRNA and Surgeon  Anesthesia Plan Comments:        Anesthesia Quick Evaluation

## 2023-05-25 LAB — SURGICAL PATHOLOGY

## 2023-05-26 ENCOUNTER — Encounter (HOSPITAL_COMMUNITY): Payer: Self-pay | Admitting: Emergency Medicine

## 2023-05-26 ENCOUNTER — Ambulatory Visit (HOSPITAL_COMMUNITY)
Admission: RE | Admit: 2023-05-26 | Discharge: 2023-05-26 | Disposition: A | Discharge status: Home / Self Care | Payer: Medicaid Other | Source: Ambulatory Visit | Attending: Emergency Medicine | Admitting: Emergency Medicine

## 2023-05-26 ENCOUNTER — Emergency Department (HOSPITAL_COMMUNITY)
Admission: EM | Admit: 2023-05-26 | Discharge: 2023-05-26 | Disposition: A | Discharge status: Home / Self Care | Payer: Medicaid Other | Attending: Emergency Medicine | Admitting: Emergency Medicine

## 2023-05-26 ENCOUNTER — Other Ambulatory Visit: Payer: Self-pay

## 2023-05-26 DIAGNOSIS — M7989 Other specified soft tissue disorders: Secondary | ICD-10-CM | POA: Diagnosis not present

## 2023-05-26 DIAGNOSIS — R238 Other skin changes: Secondary | ICD-10-CM | POA: Diagnosis not present

## 2023-05-26 DIAGNOSIS — M79605 Pain in left leg: Secondary | ICD-10-CM | POA: Diagnosis present

## 2023-05-26 DIAGNOSIS — J45909 Unspecified asthma, uncomplicated: Secondary | ICD-10-CM | POA: Diagnosis not present

## 2023-05-26 DIAGNOSIS — Z7982 Long term (current) use of aspirin: Secondary | ICD-10-CM | POA: Insufficient documentation

## 2023-05-26 DIAGNOSIS — I10 Essential (primary) hypertension: Secondary | ICD-10-CM | POA: Diagnosis not present

## 2023-05-26 DIAGNOSIS — Z95828 Presence of other vascular implants and grafts: Secondary | ICD-10-CM | POA: Diagnosis not present

## 2023-05-26 DIAGNOSIS — Z79899 Other long term (current) drug therapy: Secondary | ICD-10-CM | POA: Diagnosis not present

## 2023-05-26 LAB — CBC WITH DIFFERENTIAL/PLATELET
Abs Immature Granulocytes: 0.01 10*3/uL (ref 0.00–0.07)
Basophils Absolute: 0.1 10*3/uL (ref 0.0–0.1)
Basophils Relative: 2 %
Eosinophils Absolute: 0.1 10*3/uL (ref 0.0–0.5)
Eosinophils Relative: 3 %
HCT: 39.5 % (ref 36.0–46.0)
Hemoglobin: 13.5 g/dL (ref 12.0–15.0)
Immature Granulocytes: 0 %
Lymphocytes Relative: 29 %
Lymphs Abs: 0.9 10*3/uL (ref 0.7–4.0)
MCH: 29.5 pg (ref 26.0–34.0)
MCHC: 34.2 g/dL (ref 30.0–36.0)
MCV: 86.4 fL (ref 80.0–100.0)
Monocytes Absolute: 0.3 10*3/uL (ref 0.1–1.0)
Monocytes Relative: 8 %
Neutro Abs: 1.9 10*3/uL (ref 1.7–7.7)
Neutrophils Relative %: 58 %
Platelets: 159 10*3/uL (ref 150–400)
RBC: 4.57 MIL/uL (ref 3.87–5.11)
RDW: 11.9 % (ref 11.5–15.5)
WBC: 3.2 10*3/uL — ABNORMAL LOW (ref 4.0–10.5)
nRBC: 0 % (ref 0.0–0.2)

## 2023-05-26 LAB — BASIC METABOLIC PANEL
Anion gap: 9 (ref 5–15)
BUN: 11 mg/dL (ref 6–20)
CO2: 24 mmol/L (ref 22–32)
Calcium: 9.3 mg/dL (ref 8.9–10.3)
Chloride: 104 mmol/L (ref 98–111)
Creatinine, Ser: 0.74 mg/dL (ref 0.44–1.00)
GFR, Estimated: >60 mL/min (ref >60)
Glucose, Bld: 102 mg/dL — ABNORMAL HIGH (ref 70–99)
Potassium: 3.6 mmol/L (ref 3.5–5.1)
Sodium: 137 mmol/L (ref 135–145)

## 2023-05-26 MED ORDER — LORAZEPAM 1 MG PO TABS
1.0000 mg | ORAL_TABLET | Freq: Once | ORAL | Status: AC
  Administered 2023-05-26: 1 mg via ORAL
  Filled 2023-05-26: qty 1

## 2023-05-26 MED ORDER — ENOXAPARIN SODIUM 100 MG/ML IJ SOSY
1.0000 mg/kg | PREFILLED_SYRINGE | Freq: Once | INTRAMUSCULAR | Status: AC
  Administered 2023-05-26: 92.5 mg via SUBCUTANEOUS
  Filled 2023-05-26: qty 1

## 2023-05-26 MED ORDER — OXYCODONE-ACETAMINOPHEN 5-325 MG PO TABS
1.0000 | ORAL_TABLET | Freq: Once | ORAL | Status: AC
  Administered 2023-05-26: 1 via ORAL
  Filled 2023-05-26: qty 1

## 2023-05-26 NOTE — Discharge Instructions (Addendum)
Return later today for ultrasound imaging of the left upper and left lower extremity.  Follow-up with Dr. Randie Heinz.

## 2023-05-26 NOTE — ED Provider Notes (Signed)
[place] EMERGENCY DEPARTMENT AT [facility] Provider Note   CSN: [id number] Arrival date & time: 05/26/23  0110     History  No chief complaint on file.   Kaylee Short is a 32 y.o. female.  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  Patient is quite tearful and states that symptoms came on fairly abruptly 3 hours ago.  She noticed redness of the left arm and states it feels heavier than normal.  She also reports swelling of the left leg.  She states she has a history of this and had a stent placed.  No history of blood clots.  Denies any fevers.  Reports that she feels hot and flushed.  She did drink alcohol several hours ago.  Home Medications Prior to Admission medications   Medication Sig Start Date End Date Taking? Authorizing Provider  albuterol (PROVENTIL HFA;VENTOLIN HFA) 108 (90 Base) MCG/ACT inhaler Inhale 1-2 puffs into the lungs every 6 (six) hours as needed for wheezing or shortness of breath. 02/19/16  Yes Cheron Schaumann K, PA-C  amLODipine (NORVASC) 10 MG tablet Take 10 mg by mouth daily. 01/01/18 05/26/23 Yes [provider]  aspirin EC 81 MG tablet Take 81 mg by mouth daily. Swallow whole.   Yes [provider]  SUBOXONE 8-2 MG FILM Dissolve 1 film under tongue twice daily and 1/2 of film under tongue before bedtime. Patient taking differently: Take 8.2 Film by mouth 2 (two) times daily. 09/08/21  Yes       Allergies    Patient has no known allergies.    Review of Systems   Review of Systems  Constitutional:  Negative for fever.  Cardiovascular:  Positive for leg swelling.  Skin:  Positive for color change.  All other systems reviewed and are negative.   Physical Exam Updated Vital Signs BP (!) 150/103   Pulse 78   Temp 98.1 F (36.7 C)   Resp  12   Ht 1.702 m (5\' 7" )   Wt 93 kg   LMP 05/18/2023 (Approximate)   SpO2 97%   BMI 32.11 kg/m  Physical Exam Vitals and nursing note reviewed.  Constitutional:      Appearance: She is well-developed. She is obese. She is not ill-appearing.     Comments: Anxious appearing and tearful  HENT:     Head: Normocephalic and atraumatic.  Eyes:     Pupils: Pupils are equal, round, and reactive to light.  Cardiovascular:     Rate and Rhythm: Normal rate and regular rhythm.     Heart sounds: Normal heart sounds.  Pulmonary:     Effort: Pulmonary effort is normal. No respiratory distress.     Breath sounds: No wheezing.  Abdominal:     Palpations: Abdomen is soft.  Musculoskeletal:     Cervical back: Neck supple.     Comments: Focused examination of the bilateral lower extremities without significant objective asymmetry, she does have 2+ DP pulses bilaterally Left arm without swelling or deformity, 2+ radial pulse  Skin:    General: Skin is warm and dry.     Comments: Well-demarcated erythema left upper extremity, erythema extends from a well-demarcated line extending distally  Neurological:     Mental Status: She is alert and oriented to person, place, and time.  Comments: 5 out of 5 strength grip, biceps, triceps, deltoid strength bilaterally  Psychiatric:        Mood and Affect: Mood normal.     ED Results / Procedures / Treatments   Labs (all labs ordered are listed, but only abnormal results are displayed) Labs Reviewed  CBC WITH DIFFERENTIAL/PLATELET - Abnormal; Notable for the following components:      Result Value   WBC 3.2 (*)    All other components within normal limits  BASIC METABOLIC PANEL - Abnormal; Notable for the following components:   Glucose, Bld 102 (*)    All other components within normal limits    EKG None  Radiology No results found.  Procedures Procedures    Medications Ordered in ED Medications  enoxaparin (LOVENOX) injection 92.5 mg  (has no administration in time range)  oxyCODONE-acetaminophen (PERCOCET/ROXICET) 5-325 MG per tablet 1 tablet (1 tablet Oral Given 05/26/23 0212)  LORazepam (ATIVAN) tablet 1 mg (1 mg Oral Given 05/26/23 0212)    ED Course/ Medical Decision Making/ A&P                             Medical Decision Making Amount and/or Complexity of Data Reviewed Labs: ordered.  Risk Prescription drug management.   This patient presents to the ED for concern of left arm redness and heaviness, left leg swelling, this involves an extensive number of treatment options, and is a complaint that carries with it a high risk of complications and morbidity.  I considered the following differential and admission for this acute, potentially life threatening condition.  The differential diagnosis includes DVT, cellulitis, vascular insufficiency, sunburn  MDM:    This is a 32 year old female who presents with left arm and left leg complaints.  She is overall nontoxic.  She is tearful and appears anxious about her symptoms.  These came on quite abruptly.  She is hypertensive to 150/103.  She has a history of the same.  Left upper extremity exam with good strength.  No objective swelling.  Well-demarcated erythema suggest something like a sunburn.  The delineation is consistent with where the sleeve would have ended.  She does state that she driven the car today and had her left arm exposed with a T-shirt on.  She is well-perfused.  Left lower extremity without objective significant swelling.  She is well-perfused.  Will order ultrasound imaging of the left lower and left upper extremities to rule out DVT or other vascular issue.  Labs obtained and largely reassuring without significant leukocytosis.  Feel redness is less consistent with cellulitis.  Discussed plan with patient.  She states she feels somewhat better after Ativan and Percocet.  She will return later for ultrasound imaging.  She was given Lovenox in the  meantime.  (Labs, imaging, consults)  Labs: I Ordered, and personally interpreted labs.  The pertinent results include: CBC, BMP  Imaging Studies ordered: I ordered imaging studies including none I independently visualized and interpreted imaging. I agree with the radiologist interpretation  Additional history obtained from review.  External records from outside source obtained and reviewed including vascular notes  Cardiac Monitoring: The patient was maintained on a cardiac monitor.  If on the cardiac monitor, I personally viewed and interpreted the cardiac monitored which showed an underlying rhythm of: Sinus rhythm  Reevaluation: After the interventions noted above, I reevaluated the patient and found that they have :improved  Social Determinants of Health:  lives  independently  Disposition: Discharge  Co morbidities that complicate the patient evaluation  Past Medical History:  Diagnosis Date   Asthma    Bronchitis    Carpal tunnel syndrome    Bilateral   Hypertension    Tennis elbow    bilateral     Medicines Meds ordered this encounter  Medications   oxyCODONE-acetaminophen (PERCOCET/ROXICET) 5-325 MG per tablet 1 tablet   LORazepam (ATIVAN) tablet 1 mg   enoxaparin (LOVENOX) injection 92.5 mg    I have reviewed the patients home medicines and have made adjustments as needed  Problem List / ED Course: Problem List Items Addressed This Visit   None Visit Diagnoses     Leg swelling    -  Primary   Redness and swelling of upper arm                       Final Clinical Impression(s) / ED Diagnoses Final diagnoses:  Leg swelling  Redness and swelling of upper arm    Rx / DC Orders ED Discharge Orders          Ordered    US Venous Img Upper Uni Left        05/26/23 0307    US Venous Img Lower Unilateral Left        05/26/23 0307              Shon Baton, MD 05/26/23 [phone]

## 2023-05-26 NOTE — ED Triage Notes (Addendum)
Pt states that she has noticed her L arm is red and swollen and states that her L leg is also swollen but "not red". Pt also c/o redness to face. States she noticed the symptoms 3 hrs ago. Pt with HTN. Pt states that her L arm "feels heavier than usual-like it's hard to lift".

## 2023-05-27 NOTE — H&P (Signed)
Primary Care Physician:  [facility] Primary Gastroenterologist:  Dr. Marletta Lor  Pre-Procedure History & Physical: HPI:  Kaylee Short is a 32 y.o. female is here for a colonoscopy to be performed for abdominal pain, pelvic pain, change in bowel habits Past Medical History:  Diagnosis Date   Asthma    Bronchitis    Carpal tunnel syndrome    Bilateral   Hypertension    Tennis elbow    bilateral    Past Surgical History:  Procedure Laterality Date   CESAREAN SECTION     three   IVC VENOGRAPHY N/A 04/04/2022   Procedure: IVC Venography;  Surgeon: Maeola Harman, MD;  Location: [facility] INVASIVE CV LAB;  Service: Cardiovascular;  Laterality: N/A;   laporoscopy pelvic     normal   LOWER EXTREMITY VENOGRAPHY N/A 04/04/2022   Procedure: LOWER EXTREMITY VENOGRAPHY;  Surgeon: Maeola Harman, MD;  Location: [facility] INVASIVE CV LAB;  Service: Cardiovascular;  Laterality: N/A;   PERIPHERAL VASCULAR INTERVENTION Left 04/04/2022   Procedure: PERIPHERAL VASCULAR INTERVENTION;  Surgeon: Maeola Harman, MD;  Location: [facility] INVASIVE CV LAB;  Service: Cardiovascular;  Laterality: Left;  COMMON VENO    Prior to Admission medications   Medication Sig Start Date End Date Taking? Authorizing Provider  albuterol (PROVENTIL HFA;VENTOLIN HFA) 108 (90 Base) MCG/ACT inhaler Inhale 1-2 puffs into the lungs every 6 (six) hours as needed for wheezing or shortness of breath. 02/19/16  Yes Cheron Schaumann K, PA-C  amLODipine (NORVASC) 10 MG tablet Take 10 mg by mouth daily. 01/01/18 05/26/23 Yes [provider]  aspirin EC 81 MG tablet Take 81 mg by mouth daily. Swallow whole.   Yes [provider]  SUBOXONE 8-2 MG FILM Dissolve 1 film under tongue twice daily and 1/2 of film under tongue before bedtime. Patient taking differently: Take 8.2 Film by mouth 2 (two) times daily. 09/08/21  Yes     Allergies as of 04/17/2023   (No Known Allergies)    History reviewed.  No pertinent family history.  Social History   Socioeconomic History   Marital status: Single    Spouse name: Not on file   Number of children: Not on file   Years of education: Not on file   Highest education level: Not on file  Occupational History   Not on file  Tobacco Use   Smoking status: Every Day    Types: E-cigarettes    Passive exposure: Never   Smokeless tobacco: Never  Vaping Use   Vaping status: Never Used  Substance and Sexual Activity   Alcohol use: Yes    Comment: occassional   Drug use: No   Sexual activity: Yes    Birth control/protection: Pill, None  Other Topics Concern   Not on file  Social History Narrative   Not on file   Social Determinants of Health   Financial Resource Strain: Low Risk  (12/02/2021)   Received from [facility], [facility]   Overall Financial Resource Strain (CARDIA)    Difficulty of Paying Living Expenses: Not hard at all  Food Insecurity: No Food Insecurity (10/25/2022)   Received from [facility], [facility], [facility]   Hunger Vital Sign    Worried About Running Out of Food in the Last Year: Never true    Ran Out of Food in the Last Year: Never true  Transportation Needs: No Transportation Needs (12/02/2021)   Received from [facility], [facility]   PRAPARE -  Administrator, Civil Service (Medical): No    Lack of Transportation (Non-Medical): No  Physical Activity: Inactive (12/02/2021)   Received from [facility], [facility]   Exercise Vital Sign    Days of Exercise per Week: 0 days    Minutes of Exercise per Session: 0 min  Stress: No Stress Concern Present (03/22/2022)   Received from [facility], [facility] of [facility] - Occupational Stress Questionnaire    Feeling of Stress : Not at all  Social Connections: Unknown (12/02/2021)   Received from [facility], [facility]   Social Connection and Isolation Panel [NHANES]    Frequency of  Communication with Friends and Family: Twice a week    Frequency of Social Gatherings with Friends and Family: Twice a week    Attends Religious Services: 1 to 4 times per year    Active Member of [employer] or Organizations: No    Attends Engineer, structural: 1 to 4 times per year    Marital Status: Not on file  Intimate Partner Violence: Not At Risk (05/26/2022)   Received from [facility], [facility]   Humiliation, Afraid, Rape, and Kick questionnaire    Fear of Current or Ex-Partner: No    Emotionally Abused: No    Physically Abused: No    Sexually Abused: No    Review of Systems: See HPI, otherwise negative ROS  Physical Exam: Vital signs in last 24 hours:     General:   Alert,  Well-developed, well-nourished, pleasant and cooperative in NAD Head:  Normocephalic and atraumatic. Eyes:  Sclera clear, no icterus.   Conjunctiva pink. Ears:  Normal auditory acuity. Nose:  No deformity, discharge,  or lesions. Msk:  Symmetrical without gross deformities. Normal posture. Extremities:  Without clubbing or edema. Neurologic:  Alert and  oriented x4;  grossly normal neurologically. Skin:  Intact without significant lesions or rashes. Psych:  Alert and cooperative. Normal mood and affect.  Impression/Plan: [name] is here for a colonoscopy to be performed for abdominal pain, pelvic pain, change in bowel habits  The risks of the procedure including infection, bleed, or perforation as well as benefits, limitations, alternatives and imponderables have been reviewed with the patient. Questions have been answered. All parties agreeable.

## 2023-05-30 ENCOUNTER — Encounter (HOSPITAL_COMMUNITY): Payer: Self-pay | Admitting: Internal Medicine

## 2023-08-07 ENCOUNTER — Encounter: Payer: Self-pay | Admitting: Gastroenterology

## 2023-09-26 NOTE — Progress Notes (Deleted)
GI Office Note    Referring Provider: [facility] Primary Care Physician:  [facility]  Primary Gastroenterologist: Hennie Duos. Marletta Lor, DO   Chief Complaint   No chief complaint on file.   History of Present Illness   Kaylee Short is a 32 y.o. female presenting today     Labs in 03/2023: negative celiac serologies. Crp, sed rate, tsh all normal.   Colonoscopy 05/2023: -one 3mm polyp in cecum -diverticulosis ascending colon -examined portion of ileum normal -repeat colonoscopy 10 years  Medications   Current Outpatient Medications  Medication Sig Dispense Refill   albuterol (PROVENTIL HFA;VENTOLIN HFA) 108 (90 Base) MCG/ACT inhaler Inhale 1-2 puffs into the lungs every 6 (six) hours as needed for wheezing or shortness of breath. 1 Inhaler 0   amLODipine (NORVASC) 10 MG tablet Take 10 mg by mouth daily.     aspirin EC 81 MG tablet Take 81 mg by mouth daily. Swallow whole.     SUBOXONE 8-2 MG FILM Dissolve 1 film under tongue twice daily and 1/2 of film under tongue before bedtime. (Patient taking differently: Take 8.2 Film by mouth 2 (two) times daily.) 75 each 0   No current facility-administered medications for this visit.    Allergies   Allergies as of 09/27/2023   (No Known Allergies)     Past Medical History   Past Medical History:  Diagnosis Date   Asthma    Bronchitis    Carpal tunnel syndrome    Bilateral   Hypertension    Tennis elbow    bilateral    Past Surgical History   Past Surgical History:  Procedure Laterality Date   CESAREAN SECTION     three   COLONOSCOPY WITH PROPOFOL N/A 05/23/2023   Procedure: COLONOSCOPY WITH PROPOFOL;  Surgeon: Lanelle Bal, DO;  [facility];  Service: Endoscopy;  Laterality: N/A;  900am, asa 2   IVC VENOGRAPHY N/A 04/04/2022   Procedure: IVC Venography;  Surgeon: Maeola Harman, MD;  Location: [facility] INVASIVE CV LAB;  Service: Cardiovascular;  Laterality:  N/A;   laporoscopy pelvic     normal   LOWER EXTREMITY VENOGRAPHY N/A 04/04/2022   Procedure: LOWER EXTREMITY VENOGRAPHY;  Surgeon: Maeola Harman, MD;  Location: [facility] INVASIVE CV LAB;  Service: Cardiovascular;  Laterality: N/A;   PERIPHERAL VASCULAR INTERVENTION Left 04/04/2022   Procedure: PERIPHERAL VASCULAR INTERVENTION;  Surgeon: Maeola Harman, MD;  Location: [facility] INVASIVE CV LAB;  Service: Cardiovascular;  Laterality: Left;  COMMON VENO   POLYPECTOMY  05/23/2023   Procedure: POLYPECTOMY;  Surgeon: Lanelle Bal, DO;  Location: [facility];  Service: Endoscopy;;    Past Family History   No family history on file.  Past Social History   Social History   Socioeconomic History   Marital status: Single    Spouse name: Not on file   Number of children: Not on file   Years of education: Not on file   Highest education level: Not on file  Occupational History   Not on file  Tobacco Use   Smoking status: Every Day    Types: E-cigarettes    Passive exposure: Never   Smokeless tobacco: Never  Vaping Use   Vaping status: Never Used  Substance and Sexual Activity   Alcohol use: Yes    Comment: occassional   Drug use: No   Sexual activity: Yes    Birth control/protection: Pill, None  Other Topics Concern  Not on file  Social History Narrative   Not on file   Social Determinants of Health   Financial Resource Strain: Low Risk  (12/02/2021)   Received from [facility], [facility]   Overall Financial Resource Strain (CARDIA)    Difficulty of Paying Living Expenses: Not hard at all  Food Insecurity: No Food Insecurity (10/25/2022)   Received from [facility], [facility]   Hunger Vital Sign    Worried About Running Out of Food in the Last Year: Never true    Ran Out of Food in the Last Year: Never true  Transportation Needs: No Transportation Needs (12/02/2021)   Received from [facility], [facility]   [facility] -  Transportation    Lack of Transportation (Medical): No    Lack of Transportation (Non-Medical): No  Physical Activity: Sufficiently Active (08/31/2023)   Received from [facility]   Exercise Vital Sign    Days of Exercise per Week: 5 days    Minutes of Exercise per Session: 60 min  Stress: No Stress Concern Present (03/22/2022)   Received from [facility], [facility] - Occupational Stress Questionnaire    Feeling of Stress : Not at all  Social Connections: Unknown (12/02/2021)   Received from [facility], [facility]   Social Connection and Isolation Panel [NHANES]    Frequency of Communication with Friends and Family: Twice a week    Frequency of Social Gatherings with Friends and Family: Twice a week    Attends Religious Services: 1 to 4 times per year    Active Member of [employer] or Organizations: No    Attends Engineer, structural: 1 to 4 times per year    Marital Status: Not on file  Intimate Partner Violence: Not At Risk (05/26/2022)   Received from [facility], [facility]   Humiliation, Afraid, Rape, and Kick questionnaire    Fear of Current or Ex-Partner: No    Emotionally Abused: No    Physically Abused: No    Sexually Abused: No    Review of Systems   General: Negative for anorexia, weight loss, fever, chills, fatigue, weakness. ENT: Negative for hoarseness, difficulty swallowing , nasal congestion. CV: Negative for chest pain, angina, palpitations, dyspnea on exertion, peripheral edema.  Respiratory: Negative for dyspnea at rest, dyspnea on exertion, cough, sputum, wheezing.  GI: See history of present illness. GU:  Negative for dysuria, hematuria, urinary incontinence, urinary frequency, nocturnal urination.  Endo: Negative for unusual weight change.     Physical Exam   There were no vitals taken for this visit.   General: Well-nourished, well-developed in no acute distress.  Eyes: No  icterus. Mouth: Oropharyngeal mucosa moist and pink , no lesions erythema or exudate. Lungs: Clear to auscultation bilaterally.  Heart: Regular rate and rhythm, no murmurs rubs or gallops.  Abdomen: Bowel sounds are normal, nontender, nondistended, no hepatosplenomegaly or masses,  no abdominal bruits or hernia , no rebound or guarding.  Rectal: ***  Extremities: No lower extremity edema. No clubbing or deformities. Neuro: Alert and oriented x 4   Skin: Warm and dry, no jaundice.   Psych: Alert and cooperative, normal mood and affect.  Labs   *** Imaging Studies   No results found.  Assessment       PLAN   ***   Leanna Battles. Melvyn Neth, MHS, PA-C [facility]

## 2023-09-27 ENCOUNTER — Ambulatory Visit: Payer: Medicaid Other | Admitting: Gastroenterology

## 2023-10-02 ENCOUNTER — Ambulatory Visit: Payer: Medicaid Other | Admitting: Gastroenterology

## 2023-10-02 ENCOUNTER — Encounter: Payer: Self-pay | Admitting: Gastroenterology

## 2023-10-12 ENCOUNTER — Other Ambulatory Visit: Payer: Self-pay | Admitting: Medical Genetics

## 2023-10-18 ENCOUNTER — Other Ambulatory Visit: Payer: Self-pay | Admitting: Medical Genetics

## 2023-11-08 ENCOUNTER — Emergency Department (HOSPITAL_COMMUNITY)
Admission: EM | Admit: 2023-11-08 | Discharge: 2023-11-08 | Disposition: A | Discharge status: Home / Self Care | Payer: Medicaid Other | Attending: Emergency Medicine | Admitting: Emergency Medicine

## 2023-11-08 ENCOUNTER — Encounter (HOSPITAL_COMMUNITY): Payer: Self-pay

## 2023-11-08 ENCOUNTER — Other Ambulatory Visit: Payer: Self-pay

## 2023-11-08 DIAGNOSIS — Z7982 Long term (current) use of aspirin: Secondary | ICD-10-CM | POA: Insufficient documentation

## 2023-11-08 DIAGNOSIS — Z79899 Other long term (current) drug therapy: Secondary | ICD-10-CM | POA: Insufficient documentation

## 2023-11-08 DIAGNOSIS — J0140 Acute pansinusitis, unspecified: Secondary | ICD-10-CM | POA: Diagnosis not present

## 2023-11-08 DIAGNOSIS — I10 Essential (primary) hypertension: Secondary | ICD-10-CM | POA: Insufficient documentation

## 2023-11-08 DIAGNOSIS — J45909 Unspecified asthma, uncomplicated: Secondary | ICD-10-CM | POA: Insufficient documentation

## 2023-11-08 LAB — COMPREHENSIVE METABOLIC PANEL
ALT: 21 U/L (ref 0–44)
AST: 19 U/L (ref 15–41)
Albumin: 4 g/dL (ref 3.5–5.0)
Alkaline Phosphatase: 59 U/L (ref 38–126)
Anion gap: 9 (ref 5–15)
BUN: 9 mg/dL (ref 6–20)
CO2: 26 mmol/L (ref 22–32)
Calcium: 9 mg/dL (ref 8.9–10.3)
Chloride: 100 mmol/L (ref 98–111)
Creatinine, Ser: 0.72 mg/dL (ref 0.44–1.00)
GFR, Estimated: >60 mL/min (ref >60)
Glucose, Bld: 101 mg/dL — ABNORMAL HIGH (ref 70–99)
Potassium: 3.4 mmol/L — ABNORMAL LOW (ref 3.5–5.1)
Sodium: 135 mmol/L (ref 135–145)
Total Bilirubin: 0.9 mg/dL (ref <1.2)
Total Protein: 7.1 g/dL (ref 6.5–8.1)

## 2023-11-08 LAB — CBC WITH DIFFERENTIAL/PLATELET
Abs Immature Granulocytes: 0.01 10*3/uL (ref 0.00–0.07)
Basophils Absolute: 0.1 10*3/uL (ref 0.0–0.1)
Basophils Relative: 1 %
Eosinophils Absolute: 0.2 10*3/uL (ref 0.0–0.5)
Eosinophils Relative: 4 %
HCT: 36.8 % (ref 36.0–46.0)
Hemoglobin: 12.3 g/dL (ref 12.0–15.0)
Immature Granulocytes: 0 %
Lymphocytes Relative: 22 %
Lymphs Abs: 0.9 10*3/uL (ref 0.7–4.0)
MCH: 29 pg (ref 26.0–34.0)
MCHC: 33.4 g/dL (ref 30.0–36.0)
MCV: 86.8 fL (ref 80.0–100.0)
Monocytes Absolute: 0.3 10*3/uL (ref 0.1–1.0)
Monocytes Relative: 8 %
Neutro Abs: 2.7 10*3/uL (ref 1.7–7.7)
Neutrophils Relative %: 65 %
Platelets: 145 10*3/uL — ABNORMAL LOW (ref 150–400)
RBC: 4.24 MIL/uL (ref 3.87–5.11)
RDW: 12 % (ref 11.5–15.5)
WBC: 4.1 10*3/uL (ref 4.0–10.5)
nRBC: 0 % (ref 0.0–0.2)

## 2023-11-08 MED ORDER — AMOXICILLIN-POT CLAVULANATE 875-125 MG PO TABS: 1.0000 | ORAL_TABLET | Freq: Two times a day (BID) | ORAL | 0 refills | Status: AC

## 2023-11-08 NOTE — ED Notes (Signed)
ED Provider at bedside. 

## 2023-11-08 NOTE — Discharge Instructions (Addendum)
Your lab work today is reassuring.  We checked your electrolytes, blood counts, kidney, liver function which are all normal.  Your blood pressure was elevated here upon arrival, but decreased to a more normal range without any intervention.  I do not want to make any adjustments to your blood pressure medication right now.  Please take and record your blood pressures at home for the next week and follow-up with your PCP within the next 2 weeks to discuss further blood pressure management.  You have been prescribed Augmentin for a possible sinus infection. Take this antibiotic 2 times a day for the next 5 days. Take the full course of your antibiotic even if you start feeling better. Antibiotics may cause you to have diarrhea.  You may use Flonase (fluticasone) 2 puffs in each nostril daily to help with congestion.  Return to the ER for any severe headache not controlled with Tylenol, uncontrolled vomiting, any other new or concerning symptoms.

## 2023-11-08 NOTE — ED Triage Notes (Signed)
Pov from home. Cc of hypertension. 169/105. Also has a headache from sinuses. And says her legs are swollen since today.  Takes 10mg  amlodipine for bp in the mornings. 155/108 in triage.

## 2023-11-08 NOTE — ED Provider Notes (Signed)
[place] EMERGENCY DEPARTMENT AT [facility] Provider Note   CSN: [id number] Arrival date & time: 11/08/23  1858     History  Chief Complaint  Patient presents with   Hypertension    Kaylee Short is a 32 y.o. female with history of asthma, hypertension, presents with concern for an elevated blood pressure reading she noticed at home today.  This prompted her to come to the ER.  States she has been taking her 10 mg amlodipine for amlodipine each morning as prescribed.  States she has also been having pressure in her sinuses going on for the past couple weeks.  Denies any fever or chills at home.  Denies any chest pain or shortness of breath.     Hypertension       Home Medications Prior to Admission medications   Medication Sig Start Date End Date Taking? Authorizing Provider  albuterol (PROVENTIL HFA;VENTOLIN HFA) 108 (90 Base) MCG/ACT inhaler Inhale 1-2 puffs into the lungs every 6 (six) hours as needed for wheezing or shortness of breath. 02/19/16  Yes Cheron Schaumann K, PA-C  amLODipine (NORVASC) 10 MG tablet Take 10 mg by mouth daily. 01/01/18 11/08/23 Yes [provider]  amoxicillin-clavulanate (AUGMENTIN) 875-125 MG tablet Take 1 tablet by mouth every 12 (twelve) hours for 5 days. 11/08/23 11/13/23 Yes Arabella Merles, PA-C  aspirin EC 81 MG tablet Take 81 mg by mouth daily. Swallow whole.   Yes [provider]  bismuth subsalicylate (PEPTO BISMOL) 262 MG/15ML suspension Take 30 mLs by mouth every 6 (six) hours as needed for indigestion or diarrhea or loose stools.   Yes [provider]  DULERA 100-5 MCG/ACT AERO Inhale 2 puffs into the lungs daily. 08/31/23  Yes [provider]  ibuprofen (ADVIL) 200 MG tablet Take 200 mg by mouth every 6 (six) hours as needed for mild pain (pain score 1-3).   Yes [provider]  ketorolac (TORADOL) 10 MG tablet Take 10 mg by mouth every 6 (six) hours as needed for moderate pain  (pain score 4-6).   Yes [provider]  pantoprazole (PROTONIX) 20 MG tablet Take 20 mg by mouth daily.   Yes [provider]  SUBOXONE 8-2 MG FILM Dissolve 1 film under tongue twice daily and 1/2 of film under tongue before bedtime. Patient taking differently: Take 8.2 Film by mouth 2 (two) times daily. 09/08/21  Yes       Allergies    Patient has no known allergies.    Review of Systems   Review of Systems  Constitutional:  Negative for fever.    Physical Exam Updated Vital Signs BP (!) 143/97   Pulse 79   Temp 98.8 F (37.1 C) (Oral)   Resp 15   Ht 5' 7.75" (1.721 m)   Wt 92.1 kg   SpO2 97%   BMI 31.09 kg/m  Physical Exam Vitals and nursing note reviewed.  Constitutional:      General: She is not in acute distress.    Appearance: She is well-developed.  HENT:     Head: Normocephalic and atraumatic.     Comments: Tender to palpation over the frontal and maxillary sinuses bilaterally Eyes:     Extraocular Movements: Extraocular movements intact.     Conjunctiva/sclera: Conjunctivae normal.     Pupils: Pupils are equal, round, and reactive to light.  Cardiovascular:     Rate and Rhythm: Normal rate and regular rhythm.     Heart sounds: No murmur heard.  Comments: 2+ radial and dorsalis pedis pulses bilaterally Pulmonary:     Effort: Pulmonary effort is normal. No respiratory distress.     Breath sounds: Normal breath sounds.  Abdominal:     Palpations: Abdomen is soft.     Tenderness: There is no abdominal tenderness.  Musculoskeletal:        General: No swelling.     Cervical back: Neck supple.     Right lower leg: No edema.     Left lower leg: No edema.  Skin:    General: Skin is warm and dry.     Capillary Refill: Capillary refill takes less than 2 seconds.  Neurological:     General: No focal deficit present.     Mental Status: She is alert.  Psychiatric:        Mood and Affect: Mood normal.     ED Results / Procedures /  Treatments   Labs (all labs ordered are listed, but only abnormal results are displayed) Labs Reviewed  CBC WITH DIFFERENTIAL/PLATELET - Abnormal; Notable for the following components:      Result Value   Platelets 145 (*)    All other components within normal limits  COMPREHENSIVE METABOLIC PANEL - Abnormal; Notable for the following components:   Potassium 3.4 (*)    Glucose, Bld 101 (*)    All other components within normal limits    EKG None  Radiology No results found.  Procedures Procedures    Medications Ordered in ED Medications - No data to display  ED Course/ Medical Decision Making/ A&P                                 Medical Decision Making Amount and/or Complexity of Data Reviewed Labs: ordered.     Differential diagnosis includes but is not limited to essential hypertension, hypertensive emergency, DVT, sinusitis, congestion, headache  ED Course:  Patient well-appearing, no acute distress.  Blood pressure is 155/108 upon arrival, but decreased to 143/97 with time without any intervention.  Patient without any signs of endorgan damage, no chest pain, shortness of breath, nausea or vomiting, headache, vision changes.  CBC unremarkable.  CMP unremarkable, no elevation in creatinine or LFTs. Patient is tender over the frontal and maxillary sinuses.  States this has been ongoing for couple weeks.  Will treat with 5-day course of Augmentin for possible bacterial sinusitis. Patient stable and appropriate for discharge home.  Impression: Hypertension Sinusitis  Disposition:  The patient was discharged home with instructions to take blood pressures at home and follow-up with PCP within the next 2 weeks for further management of blood pressures.  Take course of Augmentin as prescribed.  Flonase as needed for nasal congestion. Return precautions given.             Final Clinical Impression(s) / ED Diagnoses Final diagnoses:  Hypertension,  unspecified type  Subacute pansinusitis    Rx / DC Orders ED Discharge Orders          Ordered    amoxicillin-clavulanate (AUGMENTIN) 875-125 MG tablet  Every 12 hours        11/08/23 2137              Arabella Merles, PA-C 11/08/23 2137    Derwood Kaplan, MD 11/09/23 [phone]

## 2023-11-25 IMAGING — CT CT L SPINE W/O CM
3 series · 12 of 33 positions shown, 14 images · non-contrast
Comparison: CT abdomen pelvis dated 04/23/2021.

CLINICAL DATA: Low back pain.



[Series 14: lumbar soft · axial · 0.36mm/px · z∈[+938,+1136]mm · 4 of 144 slices shown, 5 images]
[im 23/144  soft-tissue]
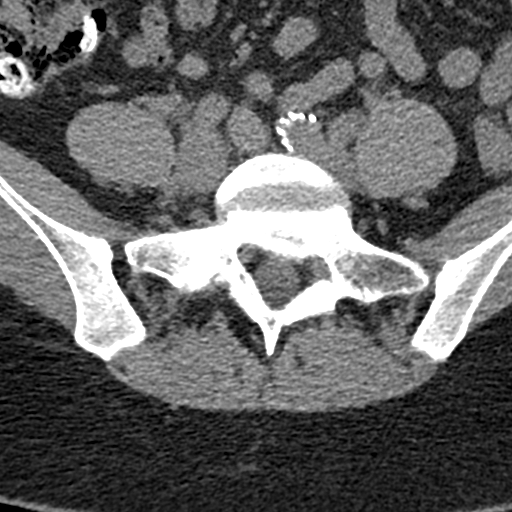
[im 23/144  bone]
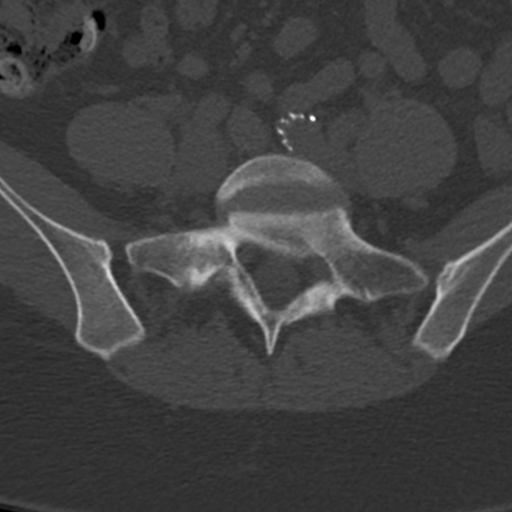
[im 56/144  bone]
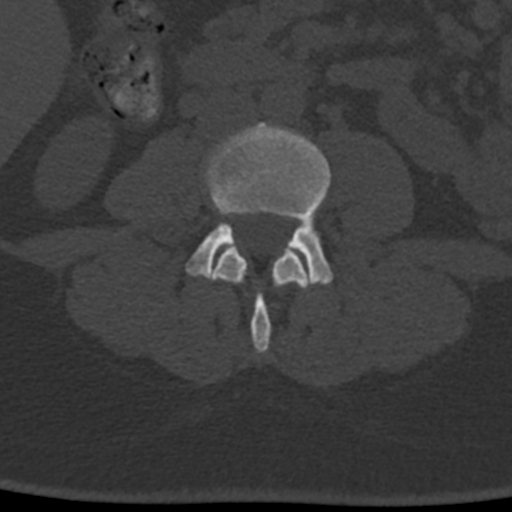
[im 89/144  bone]
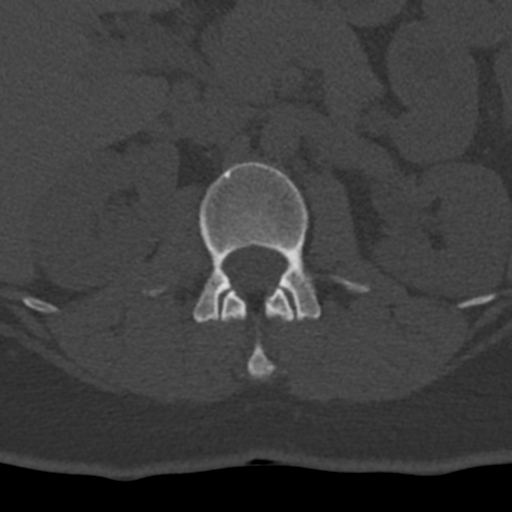
[im 122/144  bone]
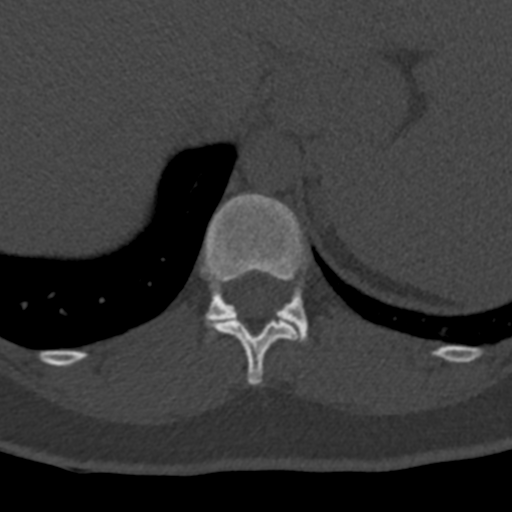

[Series 19: lumbar cor bone · coronal · 0.36mm/px · 3 of 83 slices shown]
[im 17/83  bone]
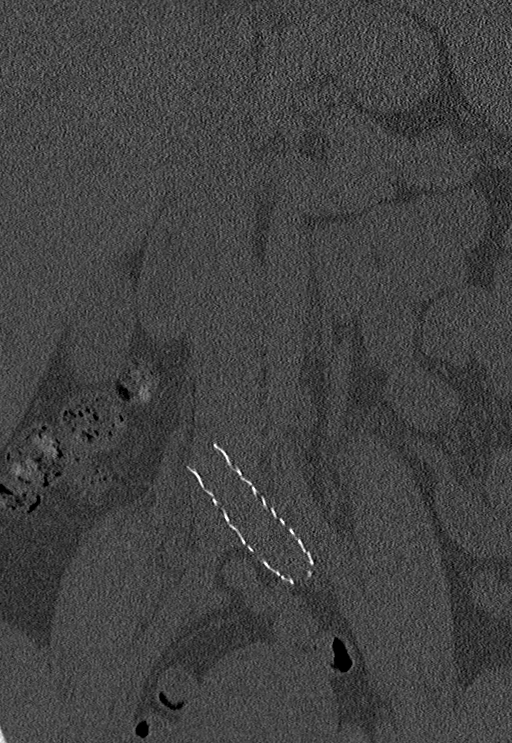
[im 33/83  bone]
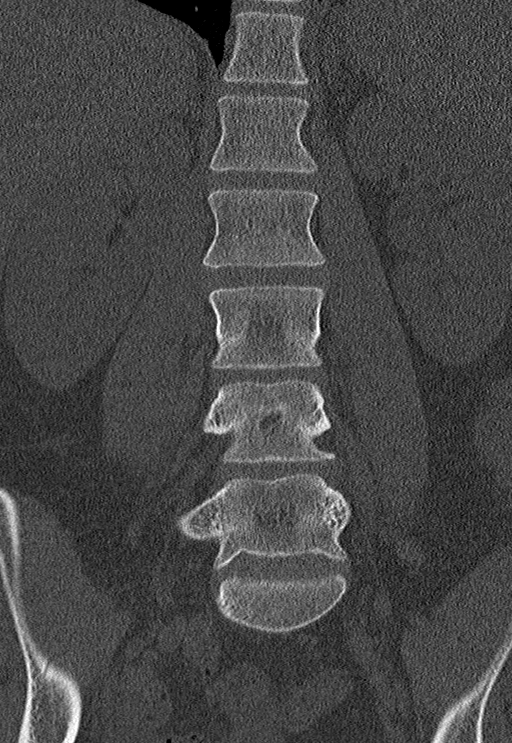
[im 50/83  bone]
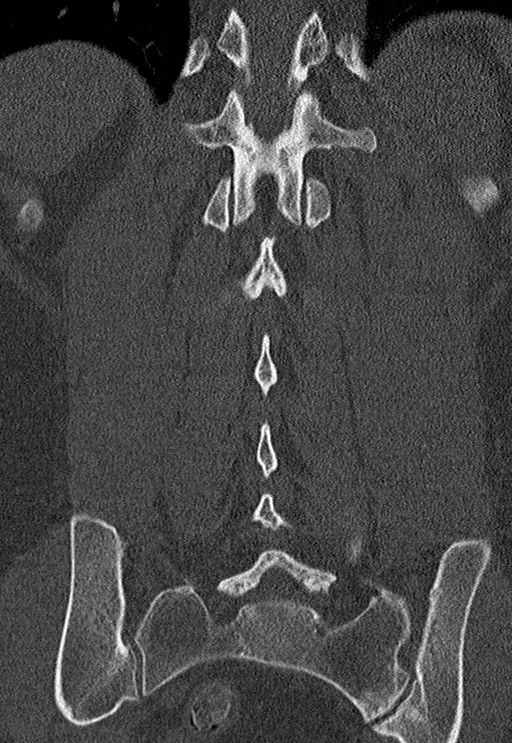

[Series 20: lumbar soft sag · sagittal · 0.32mm/px · 5 of 92 slices shown, 6 images]
[im 31/92  bone]
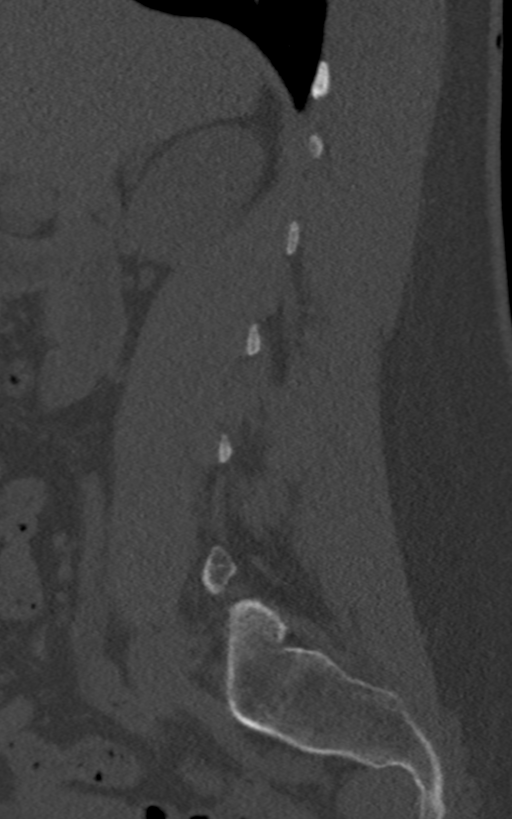
[im 38/92  bone]
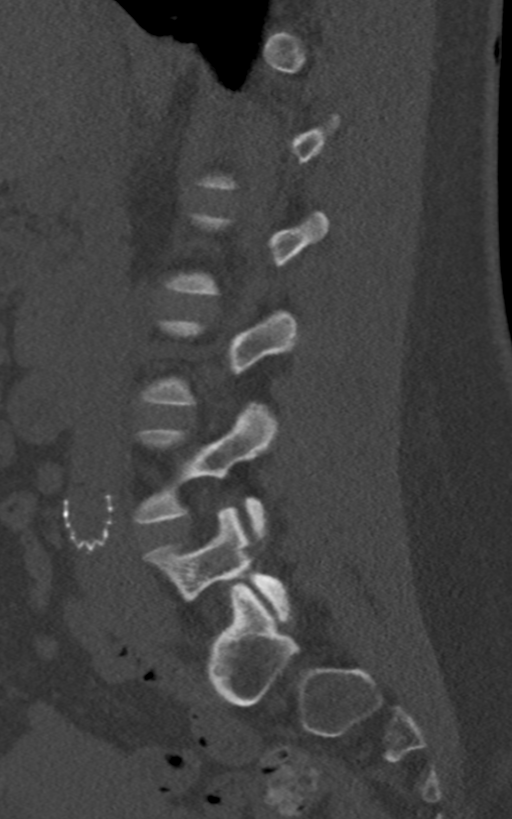
[im 46/92  soft-tissue]
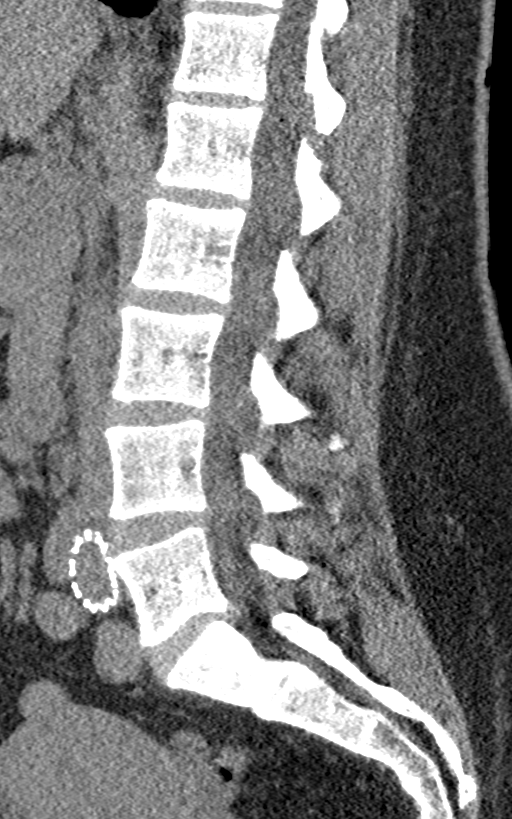
[im 46/92  bone]
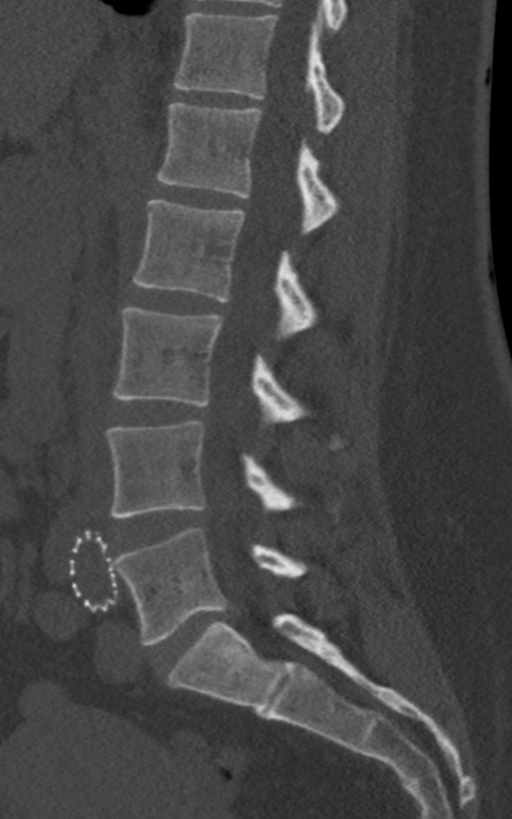
[im 54/92  bone]
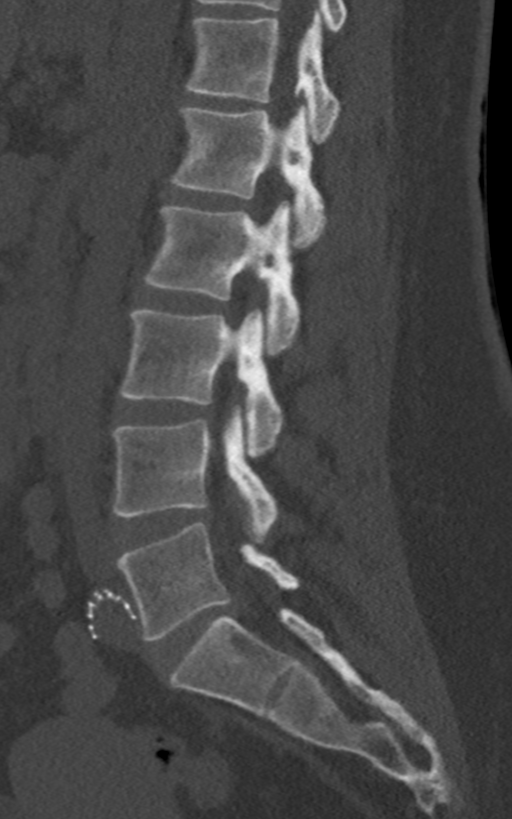
[im 61/92  bone]
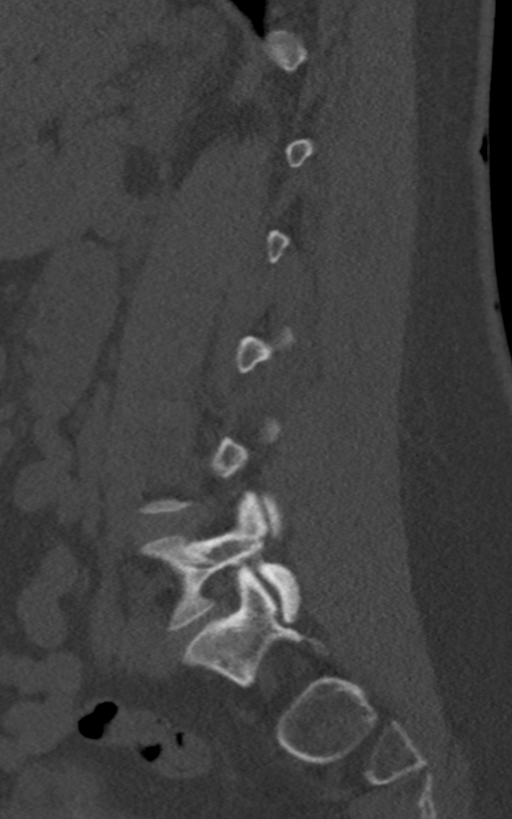

[12 of 33 positions shown; findings below may reference images not displayed]

FINDINGS: Segmentation: 5 lumbar type vertebrae.

Alignment: Normal.

Vertebrae: No acute fracture or focal pathologic process.

Paraspinal and other soft tissues: Negative.

Disc levels: No acute findings. No significant degenerative changes.
Minimal (grade 1) L5-S1 retrolisthesis.
IMPRESSION: No acute lumbar spine pathology.  No degenerative changes.

## 2024-02-09 ENCOUNTER — Other Ambulatory Visit (HOSPITAL_COMMUNITY)

## 2024-02-12 ENCOUNTER — Other Ambulatory Visit (HOSPITAL_COMMUNITY)

## 2024-03-01 ENCOUNTER — Other Ambulatory Visit (HOSPITAL_COMMUNITY): Payer: Self-pay | Admitting: Radiology

## 2024-03-01 DIAGNOSIS — J45909 Unspecified asthma, uncomplicated: Secondary | ICD-10-CM

## 2024-03-01 DIAGNOSIS — F17211 Nicotine dependence, cigarettes, in remission: Secondary | ICD-10-CM

## 2024-03-11 ENCOUNTER — Encounter (HOSPITAL_COMMUNITY): Payer: Self-pay

## 2024-03-11 ENCOUNTER — Telehealth: Payer: Self-pay

## 2024-03-11 ENCOUNTER — Emergency Department (HOSPITAL_COMMUNITY)
Admission: EM | Admit: 2024-03-11 | Discharge: 2024-03-12 | Disposition: A | Discharge status: Home / Self Care | Attending: Emergency Medicine | Admitting: Emergency Medicine

## 2024-03-11 ENCOUNTER — Emergency Department (HOSPITAL_COMMUNITY)

## 2024-03-11 ENCOUNTER — Other Ambulatory Visit: Payer: Self-pay

## 2024-03-11 DIAGNOSIS — Z79899 Other long term (current) drug therapy: Secondary | ICD-10-CM | POA: Diagnosis not present

## 2024-03-11 DIAGNOSIS — I10 Essential (primary) hypertension: Secondary | ICD-10-CM | POA: Diagnosis not present

## 2024-03-11 DIAGNOSIS — J45909 Unspecified asthma, uncomplicated: Secondary | ICD-10-CM | POA: Diagnosis not present

## 2024-03-11 DIAGNOSIS — Z7982 Long term (current) use of aspirin: Secondary | ICD-10-CM | POA: Insufficient documentation

## 2024-03-11 DIAGNOSIS — M79652 Pain in left thigh: Secondary | ICD-10-CM | POA: Insufficient documentation

## 2024-03-11 DIAGNOSIS — M79605 Pain in left leg: Secondary | ICD-10-CM | POA: Diagnosis present

## 2024-03-11 NOTE — Telephone Encounter (Signed)
 Triage: -pt called LM stated she was originally called to book 1 yr follow up appt because she was experiencing some swelling.  -returned call and pt stated both her legs were swelling but most concerning is she is having a lot of discomfort in her groin/lower abd and even though she is always cold, she states both her legs were cooler than normal.  She remembers how it was before and is worried now with what she is feeling and what's happening- she thinks she is going to go to the hospital. -will call back if she needs anything

## 2024-03-11 NOTE — ED Provider Notes (Signed)
 [place] EMERGENCY DEPARTMENT AT [facility] Provider Note   CSN: [id number] Arrival date & time: 03/11/24  7829     History  Chief Complaint  Patient presents with   Leg Pain    Kaylee Short is a 33 y.o. 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 Pt denies injury . No history of blood clots. Denies any fevers.    Past Medical History:  Diagnosis Date   Asthma    Bronchitis    Carpal tunnel syndrome    Bilateral   Hypertension    Tennis elbow    bilateral       Home Medications Prior to Admission medications   Medication Sig Start Date End Date Taking? Authorizing Provider  albuterol  (PROVENTIL  HFA;VENTOLIN  HFA) 108 (90 Base) MCG/ACT inhaler Inhale 1-2 puffs into the lungs every 6 (six) hours as needed for wheezing or shortness of breath. 02/19/16   Sofia, Leslie K, PA-C  amLODipine  (NORVASC ) 10 MG tablet Take 10 mg by mouth daily. 01/01/18 11/08/23  [provider]  aspirin  EC 81 MG tablet Take 81 mg by mouth daily. Swallow whole.    [provider]  bismuth subsalicylate (PEPTO BISMOL) 262 MG/15ML suspension Take 30 mLs by mouth every 6 (six) hours as needed for indigestion or diarrhea or loose stools.    [provider]  DULERA 100-5 MCG/ACT AERO Inhale 2 puffs into the lungs daily. 08/31/23   [provider]  ibuprofen  (ADVIL ) 200 MG tablet Take 200 mg by mouth every 6 (six) hours as needed for mild pain (pain score 1-3).    [provider]  ketorolac  (TORADOL ) 10 MG tablet Take 10 mg by mouth every 6 (six) hours as needed for moderate pain (pain score 4-6).    [provider]  pantoprazole  (PROTONIX ) 20 MG tablet Take 20 mg by mouth daily.    [provider]  SUBOXONE  8-2 MG FILM  Dissolve 1 film under tongue twice daily and 1/2 of film under tongue before bedtime. Patient taking differently: Take 8.2 Film by mouth 2 (two) times daily. 09/08/21         Allergies    Patient has no known allergies.    Review of Systems   Review of Systems A 10 point review of systems was performed and is negative unless otherwise reported in HPI.  Physical Exam Updated Vital Signs BP (!) 140/90   Pulse 80   Temp 98.2 F (36.8 C)   Resp 17   Ht 5\' 7"  (1.702 m)   Wt 93 kg   LMP 03/09/2024 (Exact Date)   SpO2 100%   BMI 32.11 kg/m  Physical Exam General: Normal appearing female, lying in bed.  HEENT: PERRLA, Sclera anicteric, MMM, trachea midline.  Cardiology: RRR, no murmurs/rubs/gallops.  2+ DP/PT pulses bilaterally. Resp: Normal respiratory rate and effort. CTAB, no wheezes, rhonchi, crackles.  Abd: Soft, non-tender, non-distended. No rebound tenderness or guarding.  GU: Deferred. MSK: Tenderness to palpation to the posterior left thigh.  No induration, fluctuance, erythema.  No overlying skin changes.  No appreciable swelling.  Full range of motion of the left ankle knee and hip.  No pitting edema.  Compartments soft, nontender.  NVI.  No overlying deformities or signs of trauma. Neuro:  A&Ox4, CNs II-XII grossly intact. MAEs. Sensation grossly intact.  Psych: Normal mood and affect.   ED Results / Procedures / Treatments   Labs (all labs ordered are listed, but only abnormal results are displayed) Labs Reviewed - No data to display  EKG None    Procedures Procedures    EMERGENCY DEPARTMENT US  SOFT TISSUE INTERPRETATION "Study: Limited Soft Tissue Ultrasound"  INDICATIONS: Pain Multiple views of the body part were obtained in real-time with a multi-frequency linear probe  PERFORMED BY: Myself IMAGES ARCHIVED?: No SIDE:Left BODY PART:Lower extremity INTERPRETATION:  No abcess noted, No cellulitis noted, and Normal soft tissue  ultrasound    Medications Ordered in ED Medications - No data to display  ED Course/ Medical Decision Making/ A&P                          Medical Decision Making Amount and/or Complexity of Data Reviewed Radiology:  Decision-making details documented in ED Course.    This patient presents to the ED for concern of left leg pain and swelling, this involves an extensive number of treatment options, and is a complaint that carries with it a high risk of complications and morbidity.    MDM:    There is no appreciable swelling on my exam.  Compartments are soft and nontender.  Patient has 2+ pedal pulses in bilateral lower extremities.  Neurovascularly intact.  No concern for in-stent stenosis or acute arterial ischemia.  She has no induration/erythema/fluctuance noted on exam that would indicate cellulitis or abscess.  Indeed, bedside ultrasound of the area in question on the posterior left thigh does not demonstrate any fluid collection or cobblestoning.  Given her pain do recommend a DVT ultrasound of the left lower extremity and will give her a order to return in the morning, however I have overall low suspicion.  Patient instructed to take Tylenol  ibuprofen  at home.  Instructed to follow-up tomorrow for DVT ultrasound.  Overall low suspicion for emergent cause of her pain.  Consider musculoskeletal pain most likely.  Given specific return precautions.  Also advised to follow-up with her primary care physician and/or vascular surgeon within the next 1 to 2 weeks.   Clinical Course as of 03/14/24 1759  Mon Mar 11, 2024  2323 DG FEMUR MIN 2 VIEWS LEFT Negative. [HN]    Clinical Course User Index [HN] Merdis Stalling, MD   Imaging Studies ordered: Femur XR neg I independently visualized and interpreted imaging. I agree with the radiologist interpretation  Additional history obtained from chart review.    Reevaluation: After the interventions noted above, I reevaluated the patient  and found that they have :stayed the same  Social Determinants of Health:  lives independently  Disposition:  DC w/ discharge instructions/return precautions. All questions answered to patient's satisfaction.    Co morbidities that complicate the patient evaluation  Past Medical History:  Diagnosis Date   Asthma    Bronchitis    Carpal tunnel syndrome    Bilateral   Hypertension    Tennis elbow    bilateral     Medicines No orders of the defined types were placed in this encounter.   I have reviewed the patients home medicines and have made adjustments as needed  Problem List / ED Course: Problem List Items Addressed This Visit   None Visit Diagnoses       Left leg pain    -  Primary  This note was created using dictation software, which may contain spelling or grammatical errors.    Merdis Stalling, MD 03/14/24 [phone]

## 2024-03-11 NOTE — Discharge Instructions (Signed)
 Thank you for coming to [facility] Emergency Department. You were seen for left leg pain. We did an exam, and imaging, and these showed no acute findings.  Please return to [facility] emergency department in the morning for a vascular ultrasound of your left leg. You can take Tylenol  1000 mg every 8 hours for pain. Please follow up with your primary care provider within 1 week.   Do not hesitate to return to the ED or call 911 if you experience: -Worsening symptoms -Numbness or tingling in your left leg -Lightheadedness, passing out -Fevers/chills -Anything else that concerns you

## 2024-03-11 NOTE — ED Triage Notes (Signed)
 Pt from home to ED with c/o left leg pain and swelling that started a couple of days ago, pt says she had iliac stent placed in left leg last year. Pt denies injury

## 2024-03-15 ENCOUNTER — Ambulatory Visit (HOSPITAL_BASED_OUTPATIENT_CLINIC_OR_DEPARTMENT_OTHER)
Admission: RE | Admit: 2024-03-15 | Discharge: 2024-03-15 | Disposition: A | Discharge status: Home / Self Care | Source: Ambulatory Visit | Attending: Emergency Medicine | Admitting: Emergency Medicine

## 2024-03-15 DIAGNOSIS — M79605 Pain in left leg: Secondary | ICD-10-CM | POA: Insufficient documentation

## 2024-05-03 ENCOUNTER — Encounter: Payer: Self-pay | Admitting: Orthopedic Surgery

## 2024-05-03 ENCOUNTER — Ambulatory Visit: Admitting: Orthopedic Surgery

## 2024-05-03 VITALS — BP 138/96 | HR 91 | Ht 67.0 in | Wt 207.0 lb

## 2024-05-03 DIAGNOSIS — R202 Paresthesia of skin: Secondary | ICD-10-CM | POA: Diagnosis not present

## 2024-05-03 NOTE — Addendum Note (Signed)
 Addended by: Marti Slates on: 05/03/2024 09:47 AM   Modules accepted: Orders

## 2024-05-03 NOTE — Progress Notes (Signed)
 New Patient Visit  Assessment: Kaylee Short is a 33 y.o. female with the following: 1. Paresthesia of skin  Plan: Kaylee Short has numbness, tingling and shooting pains in bilateral hands.  Left is worse than right.  She has previously had a nerve conduction study, but this was several years ago.  Symptoms have progressed since then.  She is interested in proceeding with repeat EMGs, in order to determine the severity of her symptoms.  Her symptoms are consistent with carpal tunnel syndrome.  I will see her in clinic once the EMG results are available.  Follow-up: Return for After EMG.  Subjective:  Chief Complaint  Patient presents with   Wrist Pain    Bilat wrist pain L > R     History of Present Illness: Kaylee Short is a 33 y.o. female who has been referred by Bevin Bucks, MD for evaluation of bilateral hand pain.  She reports that she has had numbness and tingling in both hands for several years.  She was previously followed by Dr. Joleen Navy, and did have EMGs, which confirmed carpal tunnel syndrome.  She has tried medications.  She wears braces.  She wakes up in the middle the night and her hands are numb.  She has noticed some loss of dexterity in both hands.  She has had injections.  Unfortunately, she has been unable to be evaluated for definitive management.   Review of Systems: No fevers or chills + numbness or tingling No chest pain No shortness of breath No bowel or bladder dysfunction No GI distress No headaches   Medical History:  Past Medical History:  Diagnosis Date   Asthma    Bronchitis    Carpal tunnel syndrome    Bilateral   Hypertension    Tennis elbow    bilateral    Past Surgical History:  Procedure Laterality Date   CESAREAN SECTION     three   COLONOSCOPY WITH PROPOFOL  N/A 05/23/2023   Procedure: COLONOSCOPY WITH PROPOFOL ;  Surgeon: Vinetta Greening, DO;  [facility];  Service: Endoscopy;  Laterality: N/A;   900am, asa 2   IVC VENOGRAPHY N/A 04/04/2022   Procedure: IVC Venography;  Surgeon: Adine Hoof, MD;  Location: [facility] INVASIVE CV LAB;  Service: Cardiovascular;  Laterality: N/A;   laporoscopy pelvic     normal   LOWER EXTREMITY VENOGRAPHY N/A 04/04/2022   Procedure: LOWER EXTREMITY VENOGRAPHY;  Surgeon: Adine Hoof, MD;  Location: [facility] INVASIVE CV LAB;  Service: Cardiovascular;  Laterality: N/A;   PERIPHERAL VASCULAR INTERVENTION Left 04/04/2022   Procedure: PERIPHERAL VASCULAR INTERVENTION;  Surgeon: Adine Hoof, MD;  Location: [facility] INVASIVE CV LAB;  Service: Cardiovascular;  Laterality: Left;  COMMON VENO   POLYPECTOMY  05/23/2023   Procedure: POLYPECTOMY;  Surgeon: Vinetta Greening, DO;  Location: [facility];  Service: Endoscopy;;    No family history on file. Social History   Tobacco Use   Smoking status: Every Day    Types: E-cigarettes    Passive exposure: Never   Smokeless tobacco: Never  Vaping Use   Vaping status: Never Used  Substance Use Topics   Alcohol use: Yes    Comment: occassional   Drug use: No    No Known Allergies  Current Meds  Medication Sig   albuterol  (PROVENTIL  HFA;VENTOLIN  HFA) 108 (90 Base) MCG/ACT inhaler Inhale 1-2 puffs into the lungs every 6 (six) hours as needed for wheezing or shortness of breath.  aspirin  EC 81 MG tablet Take 81 mg by mouth daily. Swallow whole.   bismuth subsalicylate (PEPTO BISMOL) 262 MG/15ML suspension Take 30 mLs by mouth every 6 (six) hours as needed for indigestion or diarrhea or loose stools.   DULERA 100-5 MCG/ACT AERO Inhale 2 puffs into the lungs daily.   ibuprofen  (ADVIL ) 200 MG tablet Take 200 mg by mouth every 6 (six) hours as needed for mild pain (pain score 1-3).   ketorolac  (TORADOL ) 10 MG tablet Take 10 mg by mouth every 6 (six) hours as needed for moderate pain (pain score 4-6).   pantoprazole  (PROTONIX ) 20 MG tablet Take 20 mg by mouth daily.   SUBOXONE  8-2 MG FILM  Dissolve 1 film under tongue twice daily and 1/2 of film under tongue before bedtime. (Patient taking differently: Take 8.2 Film by mouth 2 (two) times daily.)    Objective: BP (!) 138/96   Pulse 91   Ht 5' 7 (1.702 m)   Wt 207 lb (93.9 kg)   BMI 32.42 kg/m   Physical Exam:  General: Alert and oriented. and No acute distress. Gait: Normal gait.  Bilateral hands without deformity.  No atrophy.  She has good grip strength.  Positive Tinel's bilaterally.  Positive carpal tunnel compression.  Positive Phalen's.  Fingers warm and well-perfused.  IMAGING: No new imaging obtained today   New Medications:  No orders of the defined types were placed in this encounter.     Tonita Frater, MD  05/03/2024 9:40 AM

## 2024-06-07 ENCOUNTER — Ambulatory Visit: Admitting: Physical Medicine and Rehabilitation

## 2024-06-07 DIAGNOSIS — M79642 Pain in left hand: Secondary | ICD-10-CM

## 2024-06-07 DIAGNOSIS — M79641 Pain in right hand: Secondary | ICD-10-CM | POA: Diagnosis not present

## 2024-06-07 DIAGNOSIS — R202 Paresthesia of skin: Secondary | ICD-10-CM | POA: Diagnosis not present

## 2024-06-07 DIAGNOSIS — R29898 Other symptoms and signs involving the musculoskeletal system: Secondary | ICD-10-CM

## 2024-06-07 NOTE — Progress Notes (Signed)
 Pain Scale   Average Pain 3 Patient advising she has bilateral hand pain with numbness,tingling and weakness. Patient advising her pain increases at night. Patient is Right Hand Dominate.        +Driver, -BT, -Dye Allergies.

## 2024-06-10 NOTE — Procedures (Unsigned)
 EMG & NCV Findings: All nerve conduction studies (as indicated in the following tables) were within normal limits.  Left vs. Right side comparison data for the ulnar motor nerve indicates abnormal L-R velocity difference (A Elbow-B Elbow, 23 m/s).  All remaining left vs. right side differences were within normal limits.    All examined muscles (as indicated in the following table) showed no evidence of electrical instability.    Impression: Essentially NORMAL electrodiagnostic study of both upper limbs.  There is no significant electrodiagnostic evidence of nerve entrapment, brachial plexopathy or cervical radiculopathy.  As you know, purely sensory or demyelinating radiculopathies and chemical radiculitis may not be detected with this particular electrodiagnostic study.  Recommendations: 1.  Follow-up with referring physician. 2.  Continue current management of symptoms.  ___________________________ Prentice Masters FAAPMR Board Certified, American Board of Physical Medicine and Rehabilitation    Nerve Conduction Studies Anti Sensory Summary Table   Stim Site NR Peak (ms) Norm Peak (ms) P-T Amp (V) Norm P-T Amp Site1 Site2 Delta-P (ms) Dist (cm) Vel (m/s) Norm Vel (m/s)  Left Median Acr Palm Anti Sensory (2nd Digit)  32.1C  Wrist    3.0 <3.6 40.0 >10 Wrist Palm 1.4 0.0    Palm    1.6 <2.0 30.5         Right Median Acr Palm Anti Sensory (2nd Digit)  30.9C  Wrist    3.1 <3.6 43.4 >10 Wrist Palm 1.5 0.0    Palm    1.6 <2.0 58.7         Left Radial Anti Sensory (Base 1st Digit)  31.4C  Wrist    2.1 <3.1 41.8  Wrist Base 1st Digit 2.1 0.0    Right Radial Anti Sensory (Base 1st Digit)  30.9C  Wrist    2.0 <3.1 30.7  Wrist Base 1st Digit 2.0 0.0    Left Ulnar Anti Sensory (5th Digit)  32.1C  Wrist    3.0 <3.7 35.0 >15.0 Wrist 5th Digit 3.0 14.0 47 >38  Right Ulnar Anti Sensory (5th Digit)  31.6C  Wrist    3.2 <3.7 32.8 >15.0 Wrist 5th Digit 3.2 14.0 44 >38   Motor Summary Table    Stim Site NR Onset (ms) Norm Onset (ms) O-P Amp (mV) Norm O-P Amp Site1 Site2 Delta-0 (ms) Dist (cm) Vel (m/s) Norm Vel (m/s)  Left Median Motor (Abd Poll Brev)  31.8C  Wrist    3.0 <4.2 6.8 >5 Elbow Wrist 3.9 19.5 50 >50  Elbow    6.9  6.2         Right Median Motor (Abd Poll Brev)  31.3C  Wrist    3.1 <4.2 10.0 >5 Elbow Wrist 3.7 20.5 55 >50  Elbow    6.8  10.3         Left Ulnar Motor (Abd Dig Min)  32.2C  Wrist    2.7 <4.2 8.8 >3 B Elbow Wrist 2.9 19.0 66 >53  B Elbow    5.6  9.3  A Elbow B Elbow 1.0 10.0 100 >53  A Elbow    6.6  9.4         Right Ulnar Motor (Abd Dig Min)  31.5C  Wrist    3.0 <4.2 9.8 >3 B Elbow Wrist 3.0 20.0 67 >53  B Elbow    6.0  10.5  A Elbow B Elbow 1.3 10.0 77 >53  A Elbow    7.3  10.5  EMG   Side Muscle Nerve Root Ins Act Fibs Psw Amp Dur Poly Recrt Int Bruna Comment  Left Abd Poll Brev Median C8-T1 Nml Nml Nml Nml Nml 0 Nml Nml   Left 1stDorInt Ulnar C8-T1 Nml Nml Nml Nml Nml 0 Nml Nml   Left PronatorTeres Median C6-7 Nml Nml Nml Nml Nml 0 Nml Nml   Left Biceps Musculocut C5-6 Nml Nml Nml Nml Nml 0 Nml Nml   Left Deltoid Axillary C5-6 Nml Nml Nml Nml Nml 0 Nml Nml     Nerve Conduction Studies Anti Sensory Left/Right Comparison   Stim Site L Lat (ms) R Lat (ms) L-R Lat (ms) L Amp (V) R Amp (V) L-R Amp (%) Site1 Site2 L Vel (m/s) R Vel (m/s) L-R Vel (m/s)  Median Acr Palm Anti Sensory (2nd Digit)  32.1C  Wrist 3.0 3.1 0.1 40.0 43.4 7.8 Wrist Palm     Palm 1.6 1.6 0.0 30.5 58.7 48.0       Radial Anti Sensory (Base 1st Digit)  31.4C  Wrist 2.1 2.0 0.1 41.8 30.7 26.6 Wrist Base 1st Digit     Ulnar Anti Sensory (5th Digit)  32.1C  Wrist 3.0 3.2 0.2 35.0 32.8 6.3 Wrist 5th Digit 47 44 3   Motor Left/Right Comparison   Stim Site L Lat (ms) R Lat (ms) L-R Lat (ms) L Amp (mV) R Amp (mV) L-R Amp (%) Site1 Site2 L Vel (m/s) R Vel (m/s) L-R Vel (m/s)  Median Motor (Abd Poll Brev)  31.8C  Wrist 3.0 3.1 0.1 6.8 10.0 32.0 Elbow Wrist 50 55 5   Elbow 6.9 6.8 0.1 6.2 10.3 39.8       Ulnar Motor (Abd Dig Min)  32.2C  Wrist 2.7 3.0 0.3 8.8 9.8 10.2 B Elbow Wrist 66 67 1  B Elbow 5.6 6.0 0.4 9.3 10.5 11.4 A Elbow B Elbow 100 77 *23  A Elbow 6.6 7.3 0.7 9.4 10.5 10.5          Waveforms:

## 2024-06-11 ENCOUNTER — Encounter: Payer: Self-pay | Admitting: Physical Medicine and Rehabilitation

## 2024-06-11 NOTE — Progress Notes (Signed)
 Kaylee Short - 33 y.o. female MRN [id number]  Date of birth: [birth year]  Office Visit Note: Visit Date: 06/07/2024 PCP: [facility] Referred by: Onesimo Oneil LABOR, MD  Subjective: Chief Complaint  Patient presents with   Left Hand - Numbness, Weakness, Pain   Right Hand - Pain, Numbness, Weakness   HPI: Kaylee Short is a 33 y.o. female who comes in today at the request of Dr. Oneil Onesimo for evaluation and management of chronic, worsening and severe pain, numbness and tingling in the Bilateral upper extremities.  Patient is Right hand dominant.  She reports worsening left more than right hand pain with paresthesias which are somewhat global in both hands.  She tells me that she did have electrodiagnostic studies by Dr. Milton in [place] who has since retired.  Unfortunately we do not have the results of that but she tells me it confirmed carpal tunnel syndrome.  She had had prior treatment with bracing and injections etc.  She did not have any carpal tunnel release.  As of late she has had worsening symptoms with the feeling of weakness at times.  She is having a lot of nocturnal complaints as well.  She does get worsening pain with activity.  She has had chronic musculoskeletal pain for many years and chronic pain management and continues with Suboxone .  She denies any frank radicular symptoms although does have some history of neck pain.  She has no history of diabetes.  Weakness is more with grip and feeling weaker in general with the hands particularly left more than right.   I spent more than 30 minutes speaking face-to-face with the patient with 50% of the time in counseling and discussing coordination of care.      Review of Systems  Musculoskeletal:  Positive for back pain, joint pain and neck pain.  Neurological:  Positive for tingling and focal weakness.  All other systems reviewed and are negative.  Otherwise per HPI.  Assessment & Plan: Visit  Diagnoses:    ICD-10-CM   1. Paresthesia of skin  R20.2 NCV with EMG (electromyography)    2. Pain in left hand  M79.642     3. Pain in right hand  M79.641     4. Weakness of both hands  R29.898        Plan: Impression: Clinically does seem to be somewhat consistent with carpal tunnel syndrome but with some nondermatomal global findings that could be an underlying pain syndrome versus musculotendinous disorder as well.  Electrodiagnostic study performed today.  Essentially NORMAL electrodiagnostic study of both upper limbs.  There is no significant electrodiagnostic evidence of nerve entrapment, brachial plexopathy or cervical radiculopathy.    As you know, purely sensory or demyelinating radiculopathies and chemical radiculitis may not be detected with this particular electrodiagnostic study.  **This electrodiagnostic study cannot rule out small fiber polyneuropathy and dysesthesias from central pain syndromes such as stroke or central pain sensitization syndromes such as fibromyalgia.  Myotomal referral pain from trigger points is also not excluded.  Recommendations: 1.  Follow-up with referring physician. 2.  Continue current management of symptoms.  Meds & Orders: No orders of the defined types were placed in this encounter.   Orders Placed This Encounter  Procedures   NCV with EMG (electromyography)    Follow-up: Return for Oneil Onesimo, MD.   Procedures: No procedures performed  EMG & NCV Findings: All nerve conduction studies (as indicated in the following tables) were within normal limits.  Left vs. Right side comparison data for the ulnar motor nerve indicates abnormal L-R velocity difference (A Elbow-B Elbow, 23 m/s).  All remaining left vs. right side differences were within normal limits.    All examined muscles (as indicated in the following table) showed no evidence of electrical instability.    Impression: Essentially NORMAL electrodiagnostic study of both upper  limbs.  There is no significant electrodiagnostic evidence of nerve entrapment, brachial plexopathy or cervical radiculopathy.    As you know, purely sensory or demyelinating radiculopathies and chemical radiculitis may not be detected with this particular electrodiagnostic study. **This electrodiagnostic study cannot rule out small fiber polyneuropathy and dysesthesias from central pain syndromes such as stroke or central pain sensitization syndromes such as fibromyalgia.  Myotomal referral pain from trigger points is also not excluded.  Recommendations: 1.  Follow-up with referring physician. 2.  Continue current management of symptoms.  ___________________________ Prentice Masters FAAPMR Board Certified, American Board of Physical Medicine and Rehabilitation    Nerve Conduction Studies Anti Sensory Summary Table   Stim Site NR Peak (ms) Norm Peak (ms) P-T Amp (V) Norm P-T Amp Site1 Site2 Delta-P (ms) Dist (cm) Vel (m/s) Norm Vel (m/s)  Left Median Acr Palm Anti Sensory (2nd Digit)  32.1C  Wrist    3.0 <3.6 40.0 >10 Wrist Palm 1.4 0.0    Palm    1.6 <2.0 30.5         Right Median Acr Palm Anti Sensory (2nd Digit)  30.9C  Wrist    3.1 <3.6 43.4 >10 Wrist Palm 1.5 0.0    Palm    1.6 <2.0 58.7         Left Radial Anti Sensory (Base 1st Digit)  31.4C  Wrist    2.1 <3.1 41.8  Wrist Base 1st Digit 2.1 0.0    Right Radial Anti Sensory (Base 1st Digit)  30.9C  Wrist    2.0 <3.1 30.7  Wrist Base 1st Digit 2.0 0.0    Left Ulnar Anti Sensory (5th Digit)  32.1C  Wrist    3.0 <3.7 35.0 >15.0 Wrist 5th Digit 3.0 14.0 47 >38  Right Ulnar Anti Sensory (5th Digit)  31.6C  Wrist    3.2 <3.7 32.8 >15.0 Wrist 5th Digit 3.2 14.0 44 >38   Motor Summary Table   Stim Site NR Onset (ms) Norm Onset (ms) O-P Amp (mV) Norm O-P Amp Site1 Site2 Delta-0 (ms) Dist (cm) Vel (m/s) Norm Vel (m/s)  Left Median Motor (Abd Poll Brev)  31.8C  Wrist    3.0 <4.2 6.8 >5 Elbow Wrist 3.9 19.5 50 >50  Elbow    6.9   6.2         Right Median Motor (Abd Poll Brev)  31.3C  Wrist    3.1 <4.2 10.0 >5 Elbow Wrist 3.7 20.5 55 >50  Elbow    6.8  10.3         Left Ulnar Motor (Abd Dig Min)  32.2C  Wrist    2.7 <4.2 8.8 >3 B Elbow Wrist 2.9 19.0 66 >53  B Elbow    5.6  9.3  A Elbow B Elbow 1.0 10.0 100 >53  A Elbow    6.6  9.4         Right Ulnar Motor (Abd Dig Min)  31.5C  Wrist    3.0 <4.2 9.8 >3 B Elbow Wrist 3.0 20.0 67 >53  B Elbow    6.0  10.5  A Elbow B Elbow  1.3 10.0 77 >53  A Elbow    7.3  10.5          EMG   Side Muscle Nerve Root Ins Act Fibs Psw Amp Dur Poly Recrt Int Bruna Comment  Left Abd Poll Brev Median C8-T1 Nml Nml Nml Nml Nml 0 Nml Nml   Left 1stDorInt Ulnar C8-T1 Nml Nml Nml Nml Nml 0 Nml Nml   Left PronatorTeres Median C6-7 Nml Nml Nml Nml Nml 0 Nml Nml   Left Biceps Musculocut C5-6 Nml Nml Nml Nml Nml 0 Nml Nml   Left Deltoid Axillary C5-6 Nml Nml Nml Nml Nml 0 Nml Nml     Nerve Conduction Studies Anti Sensory Left/Right Comparison   Stim Site L Lat (ms) R Lat (ms) L-R Lat (ms) L Amp (V) R Amp (V) L-R Amp (%) Site1 Site2 L Vel (m/s) R Vel (m/s) L-R Vel (m/s)  Median Acr Palm Anti Sensory (2nd Digit)  32.1C  Wrist 3.0 3.1 0.1 40.0 43.4 7.8 Wrist Palm     Palm 1.6 1.6 0.0 30.5 58.7 48.0       Radial Anti Sensory (Base 1st Digit)  31.4C  Wrist 2.1 2.0 0.1 41.8 30.7 26.6 Wrist Base 1st Digit     Ulnar Anti Sensory (5th Digit)  32.1C  Wrist 3.0 3.2 0.2 35.0 32.8 6.3 Wrist 5th Digit 47 44 3   Motor Left/Right Comparison   Stim Site L Lat (ms) R Lat (ms) L-R Lat (ms) L Amp (mV) R Amp (mV) L-R Amp (%) Site1 Site2 L Vel (m/s) R Vel (m/s) L-R Vel (m/s)  Median Motor (Abd Poll Brev)  31.8C  Wrist 3.0 3.1 0.1 6.8 10.0 32.0 Elbow Wrist 50 55 5  Elbow 6.9 6.8 0.1 6.2 10.3 39.8       Ulnar Motor (Abd Dig Min)  32.2C  Wrist 2.7 3.0 0.3 8.8 9.8 10.2 B Elbow Wrist 66 67 1  B Elbow 5.6 6.0 0.4 9.3 10.5 11.4 A Elbow B Elbow 100 77 *23  A Elbow 6.6 7.3 0.7 9.4 10.5 10.5           Waveforms:                     Clinical History: No specialty comments available.   She reports that she has been smoking e-cigarettes. She has never been exposed to tobacco smoke. She has never used smokeless tobacco. No results for input(s): HGBA1C, LABURIC in the last 8760 hours.  Objective:  VS:  HT:    WT:   BMI:     BP:   HR: bpm  TEMP: ( )  RESP:  Physical Exam Vitals and nursing note reviewed.  Constitutional:      General: She is not in acute distress.    Appearance: Normal appearance. She is well-developed. She is obese. She is not ill-appearing.  HENT:     Head: Normocephalic and atraumatic.  Eyes:     Conjunctiva/sclera: Conjunctivae normal.     Pupils: Pupils are equal, round, and reactive to light.  Cardiovascular:     Rate and Rhythm: Normal rate.     Pulses: Normal pulses.  Pulmonary:     Effort: Pulmonary effort is normal.  Musculoskeletal:        General: Tenderness present. No swelling or deformity.     Right lower leg: No edema.     Left lower leg: No edema.     Comments: Inspection reveals no atrophy of the  bilateral APB or FDI or hand intrinsics. There is no swelling, color changes, allodynia or dystrophic changes. There is 5 out of 5 strength in the bilateral wrist extension, finger abduction and long finger flexion. There is intact sensation to light touch in all dermatomal and peripheral nerve distributions. There is a negative Phalen's test bilaterally. There is a negative Hoffmann's test bilaterally.  Skin:    General: Skin is warm and dry.     Findings: No erythema or rash.  Neurological:     General: No focal deficit present.     Mental Status: She is alert and oriented to person, place, and time.     Cranial Nerves: No cranial nerve deficit.     Sensory: No sensory deficit.     Motor: No weakness or abnormal muscle tone.     Coordination: Coordination normal.     Gait: Gait normal.  Psychiatric:        Mood and Affect:  Mood normal.        Behavior: Behavior normal.     Ortho Exam  Imaging: No results found.  Past Medical/Family/Surgical/Social History: Medications & Allergies reviewed per EMR, new medications updated. 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 No pertinent family history. Past Surgical History:  Procedure Laterality Date   CESAREAN SECTION     three   COLONOSCOPY WITH PROPOFOL  N/A 05/23/2023   Procedure: COLONOSCOPY WITH PROPOFOL ;  Surgeon: Cindie Carlin POUR, DO;  Location[facility];  Service: Endoscopy;  Laterality: N/A;  900am, asa 2   IVC VENOGRAPHY N/A 04/04/2022   Procedure: IVC Venography;  Surgeon: Sheree Penne Bruckner, MD;  Location: [facility] INVASIVE CV LAB;  Service: Cardiovascular;  Laterality: N/A;   laporoscopy pelvic     normal   LOWER EXTREMITY VENOGRAPHY N/A 04/04/2022   Procedure: LOWER EXTREMITY VENOGRAPHY;  Surgeon: Sheree Penne Bruckner, MD;  Location: [facility] INVASIVE CV LAB;  Service: Cardiovascular;  Laterality: N/A;   PERIPHERAL VASCULAR INTERVENTION Left 04/04/2022   Procedure: PERIPHERAL VASCULAR INTERVENTION;  Surgeon: Sheree Penne Bruckner, MD;  Location: [facility] INVASIVE CV LAB;  Service: Cardiovascular;  Laterality: Left;  COMMON VENO   POLYPECTOMY  05/23/2023   Procedure: POLYPECTOMY;  Surgeon: Cindie Carlin POUR, DO;  Location: [facility];  Service: Endoscopy;;   Social History   Occupational History   Not on file  Tobacco Use   Smoking  status: Every Day    Types: E-cigarettes    Passive exposure: Never   Smokeless tobacco: Never  Vaping Use   Vaping status: Never Used  Substance and Sexual Activity   Alcohol use: Yes    Comment: occassional   Drug use: No   Sexual activity: Yes    Birth control/protection: Pill, None

## 2024-07-23 ENCOUNTER — Ambulatory Visit: Admitting: Orthopedic Surgery

## 2024-07-23 ENCOUNTER — Encounter: Payer: Self-pay | Admitting: Orthopedic Surgery

## 2024-07-23 DIAGNOSIS — R202 Paresthesia of skin: Secondary | ICD-10-CM

## 2024-07-23 NOTE — Progress Notes (Signed)
 Return Patient Visit  Assessment: Kaylee Short is a 33 y.o. female with the following: 1. Paresthesia of skin  Plan: Kaylee Short has numbness, tingling and shooting pains in bilateral hands.  Overall, her symptoms are mild.  Left is definitely worse than the right.  Recent EMG results are available in clinic today, and are deemed to be normal.  Nonetheless, she does have some symptoms which are consistent with carpal tunnel syndrome.  She has previously had injections, and these did provide improvement in her symptoms.  As a result, I am recommending an injection.  She has previously had a painful reaction to an injection, and would prefer to return to clinic when she does not have to go to work.  She can schedule that injection when it is convenient for her.  Otherwise, follow-up as needed.   Follow-up: Return for Ultrasound guided injection.  Subjective:  Chief Complaint  Patient presents with   Results    EMG results.     History of Present Illness: Kaylee Short is a 33 y.o. female who returns to clinic for repeat evaluation of bilateral hand pain.  I have seen her in clinic, and she recently obtained EMGs in bilateral hands.  She is here to discuss the findings.  She continues to have some numbness and tingling in bilateral hands.  Occasional radiating pains proximally into her forearm.  She uses braces consistently.  She does have a history of injections, initially provided good relief.  However, the more recent injection made her symptoms worse, and did cause some excruciating pains.   Review of Systems: No fevers or chills + numbness or tingling No chest pain No shortness of breath No bowel or bladder dysfunction No GI distress No headaches    Objective: There were no vitals taken for this visit.  Physical Exam:  General: Alert and oriented. and No acute distress. Gait: Normal gait.  Bilateral hands without deformity.  No atrophy.  She has good  grip strength.  Positive Tinel's bilaterally.  Positive carpal tunnel compression.  Positive Phalen's.  Fingers warm and well-perfused.  Mildly positive symptoms overall.  Provocative testing is worse in the left compared to the right.  IMAGING: No new imaging obtained today  EMG results for bilateral hands are available clinic, and are normal bilaterally.   New Medications:  No orders of the defined types were placed in this encounter.     Oneil DELENA Horde, MD  07/23/2024 11:57 AM

## 2024-08-02 ENCOUNTER — Ambulatory Visit (INDEPENDENT_AMBULATORY_CARE_PROVIDER_SITE_OTHER): Admitting: Orthopedic Surgery

## 2024-08-02 DIAGNOSIS — M79642 Pain in left hand: Secondary | ICD-10-CM

## 2024-08-02 DIAGNOSIS — R202 Paresthesia of skin: Secondary | ICD-10-CM | POA: Diagnosis not present

## 2024-08-02 NOTE — Progress Notes (Signed)
 Return Patient Visit  Assessment: Kaylee Short is a 33 y.o. female with the following: 1. Paresthesia of skin  Plan: Wallace L Hochmuth has numbness, tingling and shooting pains in bilateral hands.  Overall, her symptoms are mild.  Her left is bothering her more than the right.  EMG is without significant compression.  We discussed proceeding with an injection.  This was completed in clinic today.  She will pay attention to her symptoms.  Follow-up as needed.  Procedure note injection - Left carpal tunnel   Verbal consent was obtained to inject the Left carpal tunnel, Timeout was completed to confirm the site of injection.   Anatomic landmarks were identified.   The skin was prepped with alcohol and ethyl chloride was sprayed at the injection site.  A 21-gauge needle was used to inject 40 mg of Depo-Medrol and 1% lidocaine  (1 cc) into the carpal tunnel space of the right wrist using a direct anterior approach.  There were no complications.  A sterile bandage was applied.    Follow-up: Return if symptoms worsen or fail to improve.  Subjective:  Chief Complaint  Patient presents with   Injections    L CT  NDC: [address]-8    History of Present Illness: Kaylee Short is a 33 y.o. female who returns to clinic for repeat evaluation of bilateral hand pain.  She has recently had EMGs, which are normal.  However, she continues to have symptoms consistent with carpal tunnel syndrome.  She has had injections in the past.  She is interested in another injection today.   Review of Systems: No fevers or chills + numbness or tingling No chest pain No shortness of breath No bowel or bladder dysfunction No GI distress No headaches    Objective: There were no vitals taken for this visit.  Physical Exam:  General: Alert and oriented. and No acute distress. Gait: Normal gait.  Bilateral hands without deformity.  No atrophy.  She has good grip strength.  Positive  Tinel's bilaterally.  Positive carpal tunnel compression.  Positive Phalen's.  Fingers warm and well-perfused.  Mildly positive symptoms overall.  Provocative testing is worse in the left compared to the right.  IMAGING: No new imaging obtained today  EMG results for bilateral hands are available clinic, and are normal bilaterally.   New Medications:  No orders of the defined types were placed in this encounter.     Oneil DELENA Horde, MD  08/02/2024 10:14 AM

## 2024-08-02 NOTE — Patient Instructions (Signed)

## 2024-09-02 ENCOUNTER — Encounter (HOSPITAL_COMMUNITY): Payer: Self-pay

## 2024-09-02 ENCOUNTER — Other Ambulatory Visit: Payer: Self-pay

## 2024-09-02 ENCOUNTER — Emergency Department (HOSPITAL_COMMUNITY)

## 2024-09-02 ENCOUNTER — Emergency Department (HOSPITAL_COMMUNITY)
Admission: EM | Admit: 2024-09-02 | Discharge: 2024-09-02 | Disposition: A | Discharge status: Home / Self Care | Attending: Emergency Medicine | Admitting: Emergency Medicine

## 2024-09-02 DIAGNOSIS — I10 Essential (primary) hypertension: Secondary | ICD-10-CM | POA: Diagnosis not present

## 2024-09-02 DIAGNOSIS — Z79899 Other long term (current) drug therapy: Secondary | ICD-10-CM | POA: Insufficient documentation

## 2024-09-02 DIAGNOSIS — Z7982 Long term (current) use of aspirin: Secondary | ICD-10-CM | POA: Diagnosis not present

## 2024-09-02 DIAGNOSIS — R519 Headache, unspecified: Secondary | ICD-10-CM | POA: Diagnosis present

## 2024-09-02 LAB — CBC WITH DIFFERENTIAL/PLATELET
Abs Immature Granulocytes: 0 K/uL (ref 0.00–0.07)
Basophils Absolute: 0.1 K/uL (ref 0.0–0.1)
Basophils Relative: 2 %
Eosinophils Absolute: 0.1 K/uL (ref 0.0–0.5)
Eosinophils Relative: 4 %
HCT: 40.9 % (ref 36.0–46.0)
Hemoglobin: 13.6 g/dL (ref 12.0–15.0)
Immature Granulocytes: 0 %
Lymphocytes Relative: 33 %
Lymphs Abs: 1.2 K/uL (ref 0.7–4.0)
MCH: 28.6 pg (ref 26.0–34.0)
MCHC: 33.3 g/dL (ref 30.0–36.0)
MCV: 86.1 fL (ref 80.0–100.0)
Monocytes Absolute: 0.3 K/uL (ref 0.1–1.0)
Monocytes Relative: 7 %
Neutro Abs: 2.1 K/uL (ref 1.7–7.7)
Neutrophils Relative %: 54 %
Platelets: 156 K/uL (ref 150–400)
RBC: 4.75 MIL/uL (ref 3.87–5.11)
RDW: 12.5 % (ref 11.5–15.5)
WBC: 3.7 K/uL — ABNORMAL LOW (ref 4.0–10.5)
nRBC: 0 % (ref 0.0–0.2)

## 2024-09-02 LAB — COMPREHENSIVE METABOLIC PANEL WITH GFR
ALT: 17 U/L (ref 0–44)
AST: 18 U/L (ref 15–41)
Albumin: 5.1 g/dL — ABNORMAL HIGH (ref 3.5–5.0)
Alkaline Phosphatase: 68 U/L (ref 38–126)
Anion gap: 13 (ref 5–15)
BUN: 11 mg/dL (ref 6–20)
CO2: 23 mmol/L (ref 22–32)
Calcium: 9.7 mg/dL (ref 8.9–10.3)
Chloride: 101 mmol/L (ref 98–111)
Creatinine, Ser: 0.75 mg/dL (ref 0.44–1.00)
GFR, Estimated: >60 mL/min (ref >60)
Glucose, Bld: 92 mg/dL (ref 70–99)
Potassium: 4 mmol/L (ref 3.5–5.1)
Sodium: 137 mmol/L (ref 135–145)
Total Bilirubin: 0.6 mg/dL (ref 0.0–1.2)
Total Protein: 8.2 g/dL — ABNORMAL HIGH (ref 6.5–8.1)

## 2024-09-02 LAB — URINALYSIS, ROUTINE W REFLEX MICROSCOPIC
Bilirubin Urine: NEGATIVE
Glucose, UA: NEGATIVE mg/dL
Hgb urine dipstick: NEGATIVE
Ketones, ur: 5 mg/dL — AB
Leukocytes,Ua: NEGATIVE
Nitrite: NEGATIVE
Protein, ur: NEGATIVE mg/dL
Specific Gravity, Urine: 1.006 (ref 1.005–1.030)
pH: 6 (ref 5.0–8.0)

## 2024-09-02 LAB — TROPONIN T, HIGH SENSITIVITY: Troponin T High Sensitivity: <15 ng/L (ref 0–19)

## 2024-09-02 LAB — MAGNESIUM: Magnesium: 2.2 mg/dL (ref 1.7–2.4)

## 2024-09-02 LAB — POC URINE PREG, ED: Preg Test, Ur: NEGATIVE

## 2024-09-02 LAB — CBG MONITORING, ED: Glucose-Capillary: 93 mg/dL (ref 70–99)

## 2024-09-02 MED ORDER — DIPHENHYDRAMINE HCL 50 MG/ML IJ SOLN
25.0000 mg | Freq: Once | INTRAMUSCULAR | Status: AC
  Administered 2024-09-02: 25 mg via INTRAVENOUS
  Filled 2024-09-02: qty 1

## 2024-09-02 MED ORDER — SODIUM CHLORIDE 0.9 % IV BOLUS
1000.0000 mL | Freq: Once | INTRAVENOUS | Status: AC
  Administered 2024-09-02: 1000 mL via INTRAVENOUS

## 2024-09-02 MED ORDER — PROCHLORPERAZINE EDISYLATE 10 MG/2ML IJ SOLN
10.0000 mg | Freq: Once | INTRAMUSCULAR | Status: AC
  Administered 2024-09-02: 10 mg via INTRAVENOUS
  Filled 2024-09-02: qty 2

## 2024-09-02 MED ORDER — KETOROLAC TROMETHAMINE 30 MG/ML IJ SOLN
30.0000 mg | Freq: Once | INTRAMUSCULAR | Status: AC
  Administered 2024-09-02: 30 mg via INTRAVENOUS
  Filled 2024-09-02: qty 1

## 2024-09-02 NOTE — ED Notes (Signed)
 Pt also reports it feels like she is having palpitations and endorses arm and back pain.

## 2024-09-02 NOTE — Discharge Instructions (Signed)
 Thank you for visiting the Emergency Department today. It was a pleasure to be part of your healthcare team.  You were treated for an acute headache and you had no acute diagnostic findings.  At home, rest, hydrate, and resume normal diet.  It is important to watch for warning signs such as worsening pain, fever, trouble breathing, chest pain, or any neurological deficits. If any of these happen, return to the Emergency Department or call 911. Please follow-up with neurology regarding an appointment for headache.

## 2024-09-02 NOTE — ED Triage Notes (Signed)
 Pt arrived via POV c/o hypertension today, and endorses a headache and reports feeling shaky. Pt also reports urinary frequency and reports all her symptoms began today.

## 2024-09-03 NOTE — ED Provider Notes (Signed)
 [facility] AT [facility] Provider Note   CSN: [id number] Arrival date & time: 09/02/24  8166     Patient presents with: Hypertension   Kaylee Short is a 33 y.o. female presents to the ED with headache and hypertension that began at lunchtime today.  The symptoms started gradually and has progressively gotten worse since onset since onset. The patient describes the headache as a squeezing pressure that wraps around her forehead, does not wax/wane, associated with photophobia, nausea, and sensitivity to sounds.  Patient states that she was seen at her doctor's appointment today when they told her that her blood pressure reports really high and she stated that contributed to some anxiety.  She currently is compliant with home blood pressure medication and does not have elevated blood pressure readings.  The patient denies numbness, tingling, weakness, syncope, seizures, chest pain, or shortness of breath.  She states she has never had headaches like this in the past.  No recent travel. No sick contacts. The patient's social history is notable for current treatment on Suboxone .    Hypertension Associated symptoms include headaches.       Prior to Admission medications   Medication Sig Start Date End Date Taking? Authorizing Provider  albuterol  (PROVENTIL  HFA;VENTOLIN  HFA) 108 (90 Base) MCG/ACT inhaler Inhale 1-2 puffs into the lungs every 6 (six) hours as needed for wheezing or shortness of breath. 02/19/16   Sofia, Leslie K, PA-C  amLODipine  (NORVASC ) 10 MG tablet Take 10 mg by mouth daily. 01/01/18 11/08/23  [provider]  aspirin  EC 81 MG tablet Take 81 mg by mouth daily. Swallow whole.    [provider]  bismuth subsalicylate (PEPTO BISMOL) 262 MG/15ML suspension Take 30 mLs by mouth every 6 (six) hours as needed for indigestion or diarrhea or loose stools.    [provider]  busPIRone (BUSPAR) 10 MG tablet Take 10 mg by  mouth 3 (three) times daily. 04/23/24   [provider]  cloNIDine (CATAPRES) 0.1 MG tablet Take 0.1 mg by mouth at bedtime. 05/23/24   [provider]  DULERA 100-5 MCG/ACT AERO Inhale 2 puffs into the lungs daily. 08/31/23   [provider]  ibuprofen  (ADVIL ) 200 MG tablet Take 200 mg by mouth every 6 (six) hours as needed for mild pain (pain score 1-3).    [provider]  ketorolac  (TORADOL ) 10 MG tablet Take 10 mg by mouth every 6 (six) hours as needed for moderate pain (pain score 4-6).    [provider]  pantoprazole  (PROTONIX ) 20 MG tablet Take 20 mg by mouth daily.    [provider]  SUBOXONE  8-2 MG FILM Dissolve 1 film under tongue twice daily and 1/2 of film under tongue before bedtime. Patient taking differently: Take 8.2 Film by mouth 2 (two) times daily. 09/08/21       Allergies: Patient has no known allergies.    Review of Systems  Gastrointestinal:  Positive for nausea.  Neurological:  Positive for headaches.    Updated Vital Signs BP 135/85   Pulse 72   Temp 98 F (36.7 C)   Resp 16   Ht 5' 7 (1.702 m)   Wt 92.1 kg   LMP 08/03/2024 (Approximate)   SpO2 98%   BMI 31.79 kg/m   Physical Exam Vitals and nursing note reviewed.  Constitutional:      General: She is not in acute distress.    Appearance: Normal appearance.  HENT:  Head: Normocephalic and atraumatic.  Eyes:     General: Vision grossly intact. Gaze aligned appropriately. No visual field deficit.    Extraocular Movements: Extraocular movements intact.     Conjunctiva/sclera: Conjunctivae normal.     Pupils: Pupils are equal, round, and reactive to light.     Comments: No visual disturbances during exam.  Cardiovascular:     Rate and Rhythm: Normal rate and regular rhythm.     Pulses: Normal pulses.  Pulmonary:     Effort: Pulmonary effort is normal. No respiratory distress.  Abdominal:     General: Abdomen is flat.     Palpations:  Abdomen is soft.     Tenderness: There is no abdominal tenderness. There is no right CVA tenderness, left CVA tenderness, guarding or rebound.     Comments: Denies abdominal pain on palpation.  Musculoskeletal:        General: Normal range of motion.     Cervical back: Normal range of motion. No rigidity. No pain with movement or spinous process tenderness. Normal range of motion.  Lymphadenopathy:     Cervical: Cervical adenopathy present.  Skin:    General: Skin is warm and dry.     Capillary Refill: Capillary refill takes less than 2 seconds.  Neurological:     General: No focal deficit present.     Mental Status: She is alert. Mental status is at baseline.     GCS: GCS eye subscore is 4. GCS verbal subscore is 5. GCS motor subscore is 6.     Cranial Nerves: No cranial nerve deficit.     Sensory: Sensation is intact.     Motor: No weakness or seizure activity.     Coordination: Coordination is intact. Finger-Nose-Finger Test normal.     Comments: CN 3-12 intact. During physical exam it was noted that patient had a what appeared to be a brief syncopal episode that lasted about 5 to 7 seconds where patient stared off and would not respond to verbal stimuli.  Once patient regained focus she became very emotional stating while that was really scary, I feel really hot and dizzy  Given her lack of confusion and orientation to surroundings makes seizure-like activity unlikely.  Psychiatric:        Mood and Affect: Mood normal.     (all labs ordered are listed, but only abnormal results are displayed) Labs Reviewed  CBC WITH DIFFERENTIAL/PLATELET - Abnormal; Notable for the following components:      Result Value   WBC 3.7 (*)    All other components within normal limits  COMPREHENSIVE METABOLIC PANEL WITH GFR - Abnormal; Notable for the following components:   Total Protein 8.2 (*)    Albumin 5.1 (*)    All other components within normal limits  URINALYSIS, ROUTINE W REFLEX  MICROSCOPIC - Abnormal; Notable for the following components:   Color, Urine STRAW (*)    Ketones, ur 5 (*)    All other components within normal limits  POC URINE PREG, ED - Normal  MAGNESIUM   CBG MONITORING, ED  TROPONIN T, HIGH SENSITIVITY    EKG: EKG Interpretation Date/Time:  Monday September 02 2024 19:06:49 EDT Ventricular Rate:  91 PR Interval:  152 QRS Duration:  88 QT Interval:  370 QTC Calculation: 455 R Axis:   65  Text Interpretation: Normal sinus rhythm with sinus arrhythmia Normal ECG When compared with ECG of 18-Oct-2022 01:00, No significant change was found Confirmed by Cleotilde Rogue ([id number]) on 09/02/2024 7:09:01  PM  Radiology: DG Chest 2 View Result Date: 09/02/2024 CLINICAL DATA:  chest pain EXAM: CHEST - 2 VIEW COMPARISON:  02/19/2016 FINDINGS: No focal airspace consolidation, pleural effusion, or pneumothorax. No cardiomegaly.No acute fracture or destructive lesion. IMPRESSION: No acute cardiopulmonary abnormality. Electronically Signed   By: Rogelia Myers M.D.   On: 09/02/2024 19:19     Procedures   Medications Ordered in the ED  ketorolac  (TORADOL ) 30 MG/ML injection 30 mg (30 mg Intravenous Given 09/02/24 2158)  prochlorperazine  (COMPAZINE ) injection 10 mg (10 mg Intravenous Given 09/02/24 2157)  diphenhydrAMINE  (BENADRYL ) injection 25 mg (25 mg Intravenous Given 09/02/24 2158)  sodium chloride  0.9 % bolus 1,000 mL (0 mLs Intravenous Stopped 09/02/24 2302)                                    Medical Decision Making Amount and/or Complexity of Data Reviewed Labs: ordered. Radiology: ordered.  Risk Prescription drug management.   Patient presents to the ED for concern of headache and high blood pressure that started this afternoon, this involves an extensive number of treatment options, and is a complaint that carries with it a high risk of complications and morbidity.    The differential diagnosis includes: CVA/ICH Cardiovascular  etiology Headache Infectious  Co morbidities that complicate the patient evaluation: Hypertension  Additional history obtained:  Additional history obtained from  Outside Medical Records   External records from outside source obtained and reviewed including medical history, surgical history, allergies, medications.  The patient was a reliable historian, providing a clear, detailed, and consistent account of the presenting symptoms and relevant medical history. The information was obtained directly from the patient and statements were documented in the patient's own words when possible. No discrepancies were noted between the history provided and available collateral sources.     Lab Tests: I ordered, and personally interpreted labs.   The pertinent results include:  CMP WNL CBC no acute findings CBG WNL Magnesium  WNL Urinalysis negative Troponin negative  Imaging Studies ordered: I ordered imaging studies including: DG chest 2 view I independently visualized and interpreted imaging which showed: No acute cardiopulmonary abnormality. I agree with the radiologist interpretation  Cardiac Monitoring: The patient was maintained on a cardiac monitor.  I personally viewed and interpreted the cardiac monitored which showed an underlying rhythm of: Both initial and repeat 12 leads were normal sinus rhythm  Medicines ordered and prescription drug management: I ordered medications: Diphenhydramine  25 mg for headache Toradol  30 mg for pain relief Compazine  10 mg for nausea Sodium chloride  0.9% bolus 1000 mL liter for rehydration Reevaluation of the patient after these medicines showed that the patient improved Patient stated that she had pain relief with this regimen and headache had completely resolved. I have reviewed the patients home medicines and have made adjustments as needed  Test Considered: Diagnostic testing was considered based on the patient's presenting symptoms,  risk factors, and initial clinical assessment.  Given patient's brief syncopal episode head CT was considered however deemed an incidental finding. Given her history, physical exam, current diagnostic findings CT would likely not aide in additional diagnostic findings at this time. The approach to diagnostic testing prioritized exclusion of life-threatening conditions  Problem List / ED Course: Problem List: Acute essential hypertension  Headache  Emergency Department Course: The patient presented with headache and increased blood pressure. Initial assessment included history, physical exam, and review of prior medical records.  Plan patient history, negative laboratory findings, negative troponin, no acute findings on imaging, no acute findings on EKG, response to pain management in ED, plan to treat outpatient for acute unspecified headache. Reevaluation: After the interventions noted above, I reevaluated the patient and found that they have :improved  Dispostion: After consideration of the diagnostic results and the patients response to treatment, I feel that the patent would benefit from discharge home with close follow-up with neurology and her PCP for further valuation and care of headaches and hypertensive episodes Clinical Assessment:    Working diagnosis: acute unspecified headache and essential hypertension  Disposition Plan: The patient is medically stable for discharge from the Emergency Department at this time. Vital signs are within normal limits, and the patient is alert, oriented, and in no acute distress. Diagnostic evaluation has been completed with no findings necessitating hospital admission or further emergent workup.  Communication:   Patient informed of disposition decision and rationale. Questions addressed.  The diagnostic results and clinical impression were discussed with the patient and  at bedside and the patient demonstrated understanding.      Final diagnoses:   Hypertension, unspecified type  Acute intractable headache, unspecified headache type    ED Discharge Orders     None          Willma Duwaine CROME, [place] 09/03/24 0106    Cleotilde Rogue, MD 09/03/24 [phone]

## 2024-09-10 ENCOUNTER — Other Ambulatory Visit: Payer: Self-pay | Admitting: Medical Genetics

## 2024-09-10 DIAGNOSIS — Z006 Encounter for examination for normal comparison and control in clinical research program: Secondary | ICD-10-CM

## 2024-09-16 ENCOUNTER — Encounter: Payer: Self-pay | Admitting: Radiology

## 2024-09-26 ENCOUNTER — Other Ambulatory Visit: Payer: Self-pay | Admitting: Vascular Surgery

## 2024-09-26 DIAGNOSIS — I871 Compression of vein: Secondary | ICD-10-CM

## 2024-10-30 ENCOUNTER — Encounter: Payer: Self-pay | Admitting: Vascular Surgery

## 2024-10-30 ENCOUNTER — Ambulatory Visit (HOSPITAL_COMMUNITY): Admission: RE | Admit: 2024-10-30 | Discharge: 2024-10-30 | Attending: Surgery | Admitting: Surgery

## 2024-10-30 ENCOUNTER — Other Ambulatory Visit: Payer: Self-pay

## 2024-10-30 ENCOUNTER — Ambulatory Visit: Admitting: Vascular Surgery

## 2024-10-30 VITALS — BP 137/91 | HR 89 | Temp 98.2°F | Ht 67.0 in | Wt 197.0 lb

## 2024-10-30 DIAGNOSIS — I871 Compression of vein: Secondary | ICD-10-CM

## 2024-10-30 NOTE — Progress Notes (Signed)
 Patient ID: Kaylee Short, female   DOB: Oct 25, 1991, 33 y.o.   MRN: [id number]  Reason for Consult: Follow-up   Referred by [facility]  Subjective:     HPI:  Kaylee Short is a 33 y.o. 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  She does still have some swelling below the knee that occurs when she has been on her feet for many hours.  She does not wear compression stockings any longer.  She remains on aspirin  daily.  She states that she has had left hip pain particularly when she is in a seated position or after significant activity but does limit her activity levels.  She believes this started after her stenting she did complain about this at her last visit.  She has not had orthopedic evaluation of the left hip.  She is able to walk without significant limitation.  Pain mild to moderate in nature.  Past Medical History:  Diagnosis Date   Asthma    Bronchitis    Carpal tunnel syndrome    Bilateral   Hypertension    Tennis elbow    bilateral   History reviewed. No pertinent family history. Past Surgical History:  Procedure Laterality Date   CESAREAN SECTION     three   COLONOSCOPY WITH PROPOFOL  N/A 05/23/2023   Procedure: COLONOSCOPY WITH PROPOFOL ;  Surgeon: Cindie Carlin POUR, DO;  Location: [facility];  Service: Endoscopy;  Laterality: N/A;  900am, asa 2   IVC VENOGRAPHY N/A 04/04/2022   Procedure: IVC Venography;  Surgeon: Sheree Penne Bruckner, MD;  Location: [facility] INVASIVE CV LAB;  Service: Cardiovascular;  Laterality: N/A;   laporoscopy pelvic     normal   LOWER EXTREMITY VENOGRAPHY N/A 04/04/2022   Procedure: LOWER EXTREMITY VENOGRAPHY;  Surgeon: Sheree Penne Bruckner, MD;  Location: [facility] INVASIVE CV LAB;  Service: Cardiovascular;  Laterality: N/A;   PERIPHERAL VASCULAR INTERVENTION Left 04/04/2022    Procedure: PERIPHERAL VASCULAR INTERVENTION;  Surgeon: Sheree Penne Bruckner, MD;  Location: [facility] INVASIVE CV LAB;  Service: Cardiovascular;  Laterality: Left;  COMMON VENO   POLYPECTOMY  05/23/2023   Procedure: POLYPECTOMY;  Surgeon: Cindie Carlin POUR, DO;  Location: [facility];  Service: Endoscopy;;    Short Social History:  Social History   Tobacco Use   Smoking status: Every Day    Types: E-cigarettes    Passive exposure: Never   Smokeless tobacco: Never  Substance Use Topics   Alcohol use: Yes    Comment: occassional    Allergies[1]  Current Outpatient Medications  Medication Sig Dispense Refill   albuterol  (PROVENTIL  HFA;VENTOLIN  HFA) 108 (90 Base) MCG/ACT inhaler Inhale 1-2 puffs into the lungs every 6 (six) hours as needed for wheezing or shortness of breath. 1 Inhaler 0   amLODipine  (NORVASC ) 10 MG tablet Take 10 mg by mouth daily.     aspirin  EC 81 MG tablet Take 81 mg by mouth daily. Swallow whole.     bismuth subsalicylate (PEPTO BISMOL) 262 MG/15ML suspension Take 30 mLs by mouth every 6 (six) hours as needed for indigestion or diarrhea or loose stools.     busPIRone (BUSPAR) 10 MG tablet Take 10 mg by mouth 3 (three) times daily.     cloNIDine (CATAPRES) 0.1 MG tablet Take 0.1 mg by mouth at bedtime.     DULERA 100-5 MCG/ACT AERO Inhale  2 puffs into the lungs daily.     ibuprofen  (ADVIL ) 200 MG tablet Take 200 mg by mouth every 6 (six) hours as needed for mild pain (pain score 1-3).     ketorolac  (TORADOL ) 10 MG tablet Take 10 mg by mouth every 6 (six) hours as needed for moderate pain (pain score 4-6).     pantoprazole  (PROTONIX ) 20 MG tablet Take 20 mg by mouth daily.     SUBOXONE  8-2 MG FILM Dissolve 1 film under tongue twice daily and 1/2 of film under tongue before bedtime. (Patient taking differently: Take 8.2 Film by mouth 2 (two) times daily.) 75 each 0   No current facility-administered medications for this visit.    Review of Systems  Constitutional:   Constitutional negative. HENT: HENT negative.  Eyes: Eyes negative.  Cardiovascular: Positive for leg swelling.  GI: Gastrointestinal negative.  Musculoskeletal: Positive for joint pain.  Skin: Skin negative.  Neurological: Neurological negative. Hematologic: Hematologic/lymphatic negative.        Objective:  Objective   Vitals:   10/30/24 0844  BP: (!) 137/91  Pulse: 89  Temp: 98.2 F (36.8 C)  SpO2: 97%  Weight: 197 lb (89.4 kg)  Height: 5' 7 (1.702 m)   Body mass index is 30.85 kg/m.  Physical Exam HENT:     Head: Normocephalic.     Nose: Nose normal.  Eyes:     Pupils: Pupils are equal, round, and reactive to light.  Cardiovascular:     Rate and Rhythm: Normal rate.  Abdominal:     General: Abdomen is flat.  Musculoskeletal:     Cervical back: Normal range of motion.     Right lower leg: No edema.     Left lower leg: No edema.  Skin:    General: Skin is warm.     Capillary Refill: Capillary refill takes less than 2 seconds.     Comments: Minimal reticular veins noted left medial calf  Neurological:     General: No focal deficit present.  Psychiatric:        Mood and Affect: Mood normal.     Data: +-------------------+---------+-----------+---------+-----------+--------+         CIV        RT-PatentRT-ThrombusLT-PatentLT-ThrombusComments  +-------------------+---------+-----------+---------+-----------+--------+  Common Iliac Prox                       patent                       +-------------------+---------+-----------+---------+-----------+--------+  Common Iliac Mid                        patent                       +-------------------+---------+-----------+---------+-----------+--------+  Common Iliac Distal                     patent                       +-------------------+---------+-----------+---------+-----------+--------+      +-------------------------+---------+-----------+---------+-----------+----   ----+            EIV            RT-PatentRT-ThrombusLT-PatentLT-ThrombusComments  +-------------------------+---------+-----------+---------+-----------+----  ----+  External Iliac Vein Prox                      patent                        +-------------------------+---------+-----------+---------+-----------+----  ----+  External Iliac Vein Mid                       patent                        +-------------------------+---------+-----------+---------+-----------+----  ----+  External Iliac Vein                           patent                        Distal                                                                      +-------------------------+---------+-----------+---------+-----------+----  ----+          Summary:  IVC/Iliac: Widely patent left common iliac vein stent with no evidence of  thrombus.  There is no evidence of thrombus involving the left external iliac vein.         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  I have recommended knee-high compression stockings as tolerated and continue aspirin  therapy.  From a left hip standpoint I am unsure if this is related to her stenting although temporally she states it occurred after the stent was placed.  Will begin with CT venogram particularly given the hip pain and occasional swelling.  If CT venogram is unremarkable we will plan referral to orthopedics in [place].  I will have a phone visit with her after CT is completed.  All questions were answered she demonstrates good understanding.    Penne Lonni [place] MD [facility]      [1] No Known Allergies

## 2024-11-19 LAB — GENECONNECT MOLECULAR SCREEN: Genetic Analysis Overall Interpretation: NEGATIVE

## 2024-11-27 ENCOUNTER — Ambulatory Visit: Admitting: Vascular Surgery

## 2024-12-04 ENCOUNTER — Ambulatory Visit (HOSPITAL_COMMUNITY)
Admission: RE | Admit: 2024-12-04 | Discharge: 2024-12-04 | Disposition: A | Discharge status: Home / Self Care | Source: Ambulatory Visit | Attending: Vascular Surgery | Admitting: Vascular Surgery

## 2024-12-04 DIAGNOSIS — I871 Compression of vein: Secondary | ICD-10-CM | POA: Insufficient documentation

## 2024-12-04 MED ORDER — IOHEXOL 300 MG/ML  SOLN: 100.0000 mL | Freq: Once | INTRAMUSCULAR | Status: DC | PRN

## 2024-12-04 MED ORDER — IOHEXOL 350 MG/ML SOLN
80.0000 mL | Freq: Once | INTRAVENOUS | Status: AC | PRN
  Administered 2024-12-04: 80 mL via INTRAVENOUS

## 2024-12-18 ENCOUNTER — Ambulatory Visit: Admitting: Vascular Surgery

## 2024-12-18 DIAGNOSIS — I871 Compression of vein: Secondary | ICD-10-CM | POA: Diagnosis not present

## 2024-12-18 NOTE — Progress Notes (Signed)
 "     Chief Complaint: Left hip pain  History of Present Illness: Kaylee Short is a 34 y.o. 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  I was able to contact her at work today and we discussed her ongoing pain which is not changed since last visit.  She has undergone CT venogram.  Past Medical History:  Diagnosis Date   Asthma    Bronchitis    Carpal tunnel syndrome    Bilateral   Hypertension    Tennis elbow    bilateral    Past Surgical History:  Procedure Laterality Date   CESAREAN SECTION     three   COLONOSCOPY WITH PROPOFOL  N/A 05/23/2023   Procedure: COLONOSCOPY WITH PROPOFOL ;  Surgeon: Cindie Carlin POUR, DO;  Location: [facility];  Service: Endoscopy;  Laterality: N/A;  900am, asa 2   IVC VENOGRAPHY N/A 04/04/2022   Procedure: IVC Venography;  Surgeon: Sheree Penne Bruckner, MD;  Location: [facility] INVASIVE CV LAB;  Service: Cardiovascular;  Laterality: N/A;   laporoscopy pelvic     normal   LOWER EXTREMITY VENOGRAPHY N/A 04/04/2022   Procedure: LOWER EXTREMITY VENOGRAPHY;  Surgeon: Sheree Penne Bruckner, MD;  Location: [facility] INVASIVE CV LAB;  Service: Cardiovascular;  Laterality: N/A;   PERIPHERAL VASCULAR INTERVENTION Left 04/04/2022   Procedure: PERIPHERAL VASCULAR INTERVENTION;  Surgeon: Sheree Penne Bruckner, MD;  Location: [facility] INVASIVE CV LAB;  Service: Cardiovascular;  Laterality: Left;  COMMON VENO   POLYPECTOMY  05/23/2023   Procedure: POLYPECTOMY;  Surgeon: Cindie Carlin POUR, DO;  Location: [facility];  Service: Endoscopy;;    Active Medications[1]  12 system ROS was negative unless otherwise noted in HPI   Observations/Objective: She demonstrates good understanding of our discussion today.  CT VENOGRAM ABDOMEN AND PELVIS   TECHNIQUE: Venographic phase images of the abdomen and pelvis were obtained following the administration of intravenous contrast.  Multiplanar reformats and maximum intensity projections were generated.   RADIATION DOSE REDUCTION: This exam was performed according to the departmental dose-optimization program which includes automated exposure control, adjustment of the mA and/or kV according to patient size and/or use of iterative reconstruction technique.   CONTRAST:  80mL OMNIPAQUE  IOHEXOL  350 MG/ML SOLN   COMPARISON:  Prior CT scan of the abdomen and pelvis 04/26/2022   FINDINGS: Lower chest: No acute abnormality.   Hepatobiliary: No focal liver abnormality is seen. No gallstones, gallbladder wall thickening, or biliary dilatation.   Pancreas: Unremarkable. No pancreatic ductal dilatation or surrounding inflammatory changes.   Spleen: Normal in size without focal abnormality.   Adrenals/Urinary Tract: Adrenal glands are unremarkable. Kidneys are normal, without renal calculi, focal lesion, or hydronephrosis. Bladder is unremarkable.   Stomach/Bowel: Stomach is within normal limits. No evidence of bowel wall thickening, distention, or inflammatory changes.   Lymphatic: No suspicious lymphadenopathy.   Reproductive: Uterus and bilateral adnexa are unremarkable.   Other: No abdominal wall hernia or abnormality. No abdominopelvic ascites.   Musculoskeletal: No acute or significant osseous findings.   IVC: No evidence for thrombus or stenosis.   Portal and mesenteric veins: No evidence for thrombus or stenosis.   Bilateral iliac veins: Self expanding metallic stent present extending from the right lateral wall of the distal inferior vena cava across the origin of the left common iliac vein and terminating in the mid common iliac vein. The stent is widely patent. No evidence of in stent thrombus or stenosis. The  right iliac vein is patent and unremarkable. No evidence of thrombus within the external iliac veins or the visualized proximal femoral systems.   IMPRESSION: Widely patent left common  iliac venous stent as described above.   No evidence of iliocaval or proximal femoral thrombus.   Assessment and Plan: 34 year old female with history of May-Thurner syndrome status post stenting.  We discussed the results of her CT scan and she demonstrates good understanding.  Will refer to orthopedics for further evaluation and she can follow-up with me in 1 year with repeat IVC iliac duplex.  I spent 10 minutes with the patient via telephone encounter.   Y[place] Pacella C. Sheree, MD [facility] Office: [phone] Pager: [phone]   12/18/2024, 9:57 AM     [1]  No outpatient medications have been marked as taking for the 12/18/24 encounter (Appointment) with Sheree Penne Bruckner, MD.   "
